# Patient Record
Sex: Male | Born: 1950 | Race: White | Marital: Married | Smoking: Never smoker
Health system: Southern US, Community
[De-identification: ages and names within clinical notes are randomized; demographics above are authoritative.]

## PROBLEM LIST (undated history)

## (undated) DIAGNOSIS — C61 Malignant neoplasm of prostate: Secondary | ICD-10-CM

## (undated) DIAGNOSIS — C4490 Unspecified malignant neoplasm of skin, unspecified: Secondary | ICD-10-CM

## (undated) DIAGNOSIS — K219 Gastro-esophageal reflux disease without esophagitis: Secondary | ICD-10-CM

## (undated) DIAGNOSIS — C4491 Basal cell carcinoma of skin, unspecified: Secondary | ICD-10-CM

## (undated) DIAGNOSIS — I10 Essential (primary) hypertension: Secondary | ICD-10-CM

## (undated) DIAGNOSIS — K7460 Unspecified cirrhosis of liver: Secondary | ICD-10-CM

## (undated) DIAGNOSIS — E119 Type 2 diabetes mellitus without complications: Secondary | ICD-10-CM

## (undated) DIAGNOSIS — T7840XA Allergy, unspecified, initial encounter: Secondary | ICD-10-CM

## (undated) DIAGNOSIS — G4700 Insomnia, unspecified: Secondary | ICD-10-CM

## (undated) DIAGNOSIS — M109 Gout, unspecified: Secondary | ICD-10-CM

## (undated) HISTORY — PX: NASAL SEPTUM SURGERY: SHX37

## (undated) HISTORY — DX: Allergy, unspecified, initial encounter: T78.40XA

## (undated) HISTORY — DX: Unspecified malignant neoplasm of skin, unspecified: C44.90

## (undated) HISTORY — DX: Malignant neoplasm of prostate: C61

## (undated) HISTORY — PX: TIPS PROCEDURE: SHX808

## (undated) HISTORY — PX: TONSILLECTOMY AND ADENOIDECTOMY: SUR1326

## (undated) HISTORY — PX: CATARACT EXTRACTION: SUR2

## (undated) HISTORY — DX: Gout, unspecified: M10.9

## (undated) HISTORY — DX: Unspecified cirrhosis of liver: K74.60

## (undated) HISTORY — DX: Basal cell carcinoma of skin, unspecified: C44.91

## (undated) HISTORY — DX: Gastro-esophageal reflux disease without esophagitis: K21.9

## (undated) HISTORY — DX: Insomnia, unspecified: G47.00

## (undated) HISTORY — DX: Type 2 diabetes mellitus without complications: E11.9

## (undated) HISTORY — PX: PROSTATE SURGERY: SHX751

---

## 2013-09-18 HISTORY — PX: HERNIA REPAIR: SHX51

## 2015-11-23 LAB — HM COLONOSCOPY

## 2017-01-22 DIAGNOSIS — M4310 Spondylolisthesis, site unspecified: Secondary | ICD-10-CM | POA: Insufficient documentation

## 2017-01-22 DIAGNOSIS — M5430 Sciatica, unspecified side: Secondary | ICD-10-CM | POA: Insufficient documentation

## 2017-01-22 DIAGNOSIS — M47816 Spondylosis without myelopathy or radiculopathy, lumbar region: Secondary | ICD-10-CM | POA: Insufficient documentation

## 2017-06-21 ENCOUNTER — Encounter: Payer: Self-pay | Admitting: Family Medicine

## 2018-09-25 LAB — HM DIABETES EYE EXAM

## 2018-10-03 LAB — HM DIABETES EYE EXAM

## 2018-10-23 LAB — HM DIABETES EYE EXAM

## 2018-10-28 LAB — HM DIABETES EYE EXAM

## 2018-11-26 LAB — HM DIABETES EYE EXAM

## 2019-03-07 ENCOUNTER — Telehealth: Payer: Self-pay

## 2019-03-07 NOTE — Telephone Encounter (Signed)
Copied from [place] [phone]. Topic: General - Other >> Mar 07, 2019 10:44 AM Carolyn Stare wrote: Req call back to become new pt

## 2019-03-10 NOTE — Telephone Encounter (Signed)
Lm to call back and schedule a new pt appt

## 2019-03-18 NOTE — Telephone Encounter (Signed)
Patient is calling again to see if he can become  New patient.  He called on 6/19 and still has not heard back from office.  CB# [phone]

## 2019-03-19 ENCOUNTER — Encounter: Payer: Self-pay | Admitting: Family Medicine

## 2019-03-19 ENCOUNTER — Telehealth: Payer: Self-pay

## 2019-03-19 ENCOUNTER — Encounter (INDEPENDENT_AMBULATORY_CARE_PROVIDER_SITE_OTHER): Payer: Self-pay

## 2019-03-19 ENCOUNTER — Ambulatory Visit (INDEPENDENT_AMBULATORY_CARE_PROVIDER_SITE_OTHER): Payer: Medicare Other | Admitting: Family Medicine

## 2019-03-19 ENCOUNTER — Other Ambulatory Visit: Payer: Self-pay

## 2019-03-19 VITALS — BP 132/60 | HR 76 | Temp 98.2°F | Resp 20 | Ht 67.75 in | Wt 300.8 lb

## 2019-03-19 DIAGNOSIS — M1A9XX0 Chronic gout, unspecified, without tophus (tophi): Secondary | ICD-10-CM

## 2019-03-19 DIAGNOSIS — E119 Type 2 diabetes mellitus without complications: Secondary | ICD-10-CM | POA: Diagnosis not present

## 2019-03-19 DIAGNOSIS — M25511 Pain in right shoulder: Secondary | ICD-10-CM

## 2019-03-19 DIAGNOSIS — E1165 Type 2 diabetes mellitus with hyperglycemia: Secondary | ICD-10-CM | POA: Insufficient documentation

## 2019-03-19 DIAGNOSIS — K219 Gastro-esophageal reflux disease without esophagitis: Secondary | ICD-10-CM | POA: Diagnosis not present

## 2019-03-19 DIAGNOSIS — K7469 Other cirrhosis of liver: Secondary | ICD-10-CM

## 2019-03-19 DIAGNOSIS — E1129 Type 2 diabetes mellitus with other diabetic kidney complication: Secondary | ICD-10-CM | POA: Insufficient documentation

## 2019-03-19 DIAGNOSIS — Z8546 Personal history of malignant neoplasm of prostate: Secondary | ICD-10-CM | POA: Diagnosis not present

## 2019-03-19 DIAGNOSIS — L299 Pruritus, unspecified: Secondary | ICD-10-CM

## 2019-03-19 DIAGNOSIS — M109 Gout, unspecified: Secondary | ICD-10-CM | POA: Insufficient documentation

## 2019-03-19 DIAGNOSIS — K7460 Unspecified cirrhosis of liver: Secondary | ICD-10-CM | POA: Insufficient documentation

## 2019-03-19 LAB — COMPREHENSIVE METABOLIC PANEL
ALT: 16 U/L (ref 0–53)
AST: 18 U/L (ref 0–37)
Albumin: 3.4 g/dL — ABNORMAL LOW (ref 3.5–5.2)
Alkaline Phosphatase: 136 U/L — ABNORMAL HIGH (ref 39–117)
BUN: 13 mg/dL (ref 6–23)
CO2: 23 mEq/L (ref 19–32)
Calcium: 9 mg/dL (ref 8.4–10.5)
Chloride: 102 mEq/L (ref 96–112)
Creatinine, Ser: 1 mg/dL (ref 0.40–1.50)
GFR: 74.23 mL/min (ref >60.00)
Glucose, Bld: 328 mg/dL — ABNORMAL HIGH (ref 70–99)
Potassium: 4.5 mEq/L (ref 3.5–5.1)
Sodium: 134 mEq/L — ABNORMAL LOW (ref 135–145)
Total Bilirubin: 0.8 mg/dL (ref 0.2–1.2)
Total Protein: 6.5 g/dL (ref 6.0–8.3)

## 2019-03-19 LAB — HEMOGLOBIN A1C: Hgb A1c MFr Bld: 11.1 % — ABNORMAL HIGH (ref 4.6–6.5)

## 2019-03-19 MED ORDER — FREESTYLE LIBRE 14 DAY SENSOR MISC: 1.0000 | 6 refills | Status: DC

## 2019-03-19 MED ORDER — HYDROXYZINE HCL 50 MG PO TABS: 50.0000 mg | ORAL_TABLET | Freq: Three times a day (TID) | ORAL | 1 refills | Status: DC | PRN

## 2019-03-19 MED ORDER — ALLOPURINOL 100 MG PO TABS: 100.0000 mg | ORAL_TABLET | Freq: Every day | ORAL | 1 refills | Status: DC

## 2019-03-19 NOTE — Progress Notes (Signed)
Subjective:     Adam Duncan is a 68 y.o. male presenting for Establish Care (previous PCP Dr. Radford Pax in [place].) and Arm Pain (right. x several years. has had physical therapy in the past.)     HPI   #Diabetes - 6 months since last checked - hemoglobin a1c 7.1 - has had cbg 250-300  - not doing as well with diet lately   #arm pain - has been to therapy 2x  - moving recently and now with more pain - told he had a ligament injury in the past - worse with sleeping on that side and moving certain positions - not painful all the time - taking ibuprofen 2-3 pills per day depending on the symptoms (over the counter dose) - with improvement  #Liver cirrhosis - does have skin itching - early stages based on blood work, but biopsy proven   #allergies - lots of skin itching  - taking hydroxyzine which is helpful - will get red patches - generally associated with rash  #Prostate cancer - 10 years ago - told he did not see urology  - has been getting annual PSAs to screen which have been very low - wearing pads because he leaks  Review of Systems See HPI  Social History   Tobacco Use  Smoking Status Never Smoker  Smokeless Tobacco Never Used        Objective:    BP Readings from Last 3 Encounters:  03/19/19 132/60   Wt Readings from Last 3 Encounters:  03/19/19 (!) 300 lb 12 oz (136.4 kg)    BP 132/60   Pulse 76   Temp 98.2 F (36.8 C)   Resp 20   Ht 5' 7.75" (1.721 m)   Wt (!) 300 lb 12 oz (136.4 kg)   SpO2 96%   BMI 46.07 kg/m    Physical Exam Constitutional:      Appearance: Normal appearance. He is obese. He is not ill-appearing or diaphoretic.  HENT:     Right Ear: External ear normal.     Left Ear: External ear normal.     Nose: Nose normal.  Eyes:     General: No scleral icterus.    Extraocular Movements: Extraocular movements intact.     Conjunctiva/sclera: Conjunctivae normal.  Neck:     Musculoskeletal: Neck supple.   Cardiovascular:     Rate and Rhythm: Normal rate and regular rhythm.     Heart sounds: Murmur (grade 2/6 systolic) present.  Pulmonary:     Effort: Pulmonary effort is normal. No respiratory distress.     Breath sounds: Normal breath sounds. No wheezing.  Musculoskeletal:     Comments: Right shoulder:  Inspection: no swelling ROM: flexion and abduction limited beyond 90 degrees, passive able to get full but with pain Strength: significant pain with supraspinatus testing, normal internal/external rotation  Skin:    General: Skin is warm and dry.  Neurological:     Mental Status: He is alert. Mental status is at baseline.  Psychiatric:        Mood and Affect: Mood normal.           Assessment & Plan:   Problem List Items Addressed This Visit      Digestive   GERD (gastroesophageal reflux disease)   Relevant Medications   LACTULOSE PO   pantoprazole (PROTONIX) 40 MG tablet   Cirrhosis of liver (HCC)    Non-alcoholic cirrhosis. Will obtain outside records. LFTs today and assess for other  monitoring. Reports some recent work-up done, has been stable overall.       Relevant Medications   hydrOXYzine (ATARAX/VISTARIL) 50 MG tablet   Other Relevant Orders   Comprehensive metabolic panel     Endocrine   Diabetes mellitus without complication ([place]) - Primary    Not doing well with diet. Will check HgbA1c. Advised focusing on lifestyle and will f/u 4 months depending on where the hgba1c is.       Relevant Medications   glipiZIDE (GLUCOTROL XL) 10 MG 24 hr tablet   metFORMIN (GLUCOPHAGE) 500 MG tablet   Continuous Blood Gluc Sensor (FREESTYLE LIBRE 14 DAY SENSOR) MISC   Other Relevant Orders   Comprehensive metabolic panel   Hemoglobin A1c     Other   History of prostate cancer    Will need annual PSA to screen for recurrence. Otherwise doing OK, some incontinence       Gout    No episodes since initial. Given liver disease likely needs allopurinol.       Relevant  Medications   allopurinol (ZYLOPRIM) 100 MG tablet   Acute pain of right shoulder    Likely rotator cuff or impingement injury. Did well with PT in the past will refer again.       Relevant Orders   Ambulatory referral to Physical Therapy   Itching    Stable with hydroxyzine. Typically related to rashes but creams do not work.        Relevant Medications   hydrOXYzine (ATARAX/VISTARIL) 50 MG tablet       Return in about 4 months (around 07/20/2019) for diabetes.  Lesleigh Noe, MD

## 2019-03-19 NOTE — Telephone Encounter (Signed)
LMTCB to scheduled new patient appointment,

## 2019-03-19 NOTE — Assessment & Plan Note (Signed)
Not doing well with diet. Will check HgbA1c. Advised focusing on lifestyle and will f/u 4 months depending on where the hgba1c is.

## 2019-03-19 NOTE — Assessment & Plan Note (Signed)
Will need annual PSA to screen for recurrence. Otherwise doing OK, some incontinence

## 2019-03-19 NOTE — Assessment & Plan Note (Signed)
Non-alcoholic cirrhosis. Will obtain outside records. LFTs today and assess for other monitoring. Reports some recent work-up done, has been stable overall.

## 2019-03-19 NOTE — Telephone Encounter (Signed)
[facility] [address] left v/m request clarification on instructions but did not leave name of med. [facility] said Adam Duncan has already answered the question and nothing further needed.

## 2019-03-19 NOTE — Assessment & Plan Note (Signed)
Likely rotator cuff or impingement injury. Did well with PT in the past will refer again.

## 2019-03-19 NOTE — Patient Instructions (Signed)
#  Diabetes - work on following the diabetic diet  #Arm pain - physical therapy referral - continue home exercise - OK for ibuprofen in small amounts - can do Tylenol as well

## 2019-03-19 NOTE — Assessment & Plan Note (Signed)
Stable with hydroxyzine. Typically related to rashes but creams do not work.

## 2019-03-19 NOTE — Assessment & Plan Note (Signed)
No episodes since initial. Given liver disease likely needs allopurinol.

## 2019-03-19 NOTE — Telephone Encounter (Signed)
Patient established care at [facility] instead.

## 2019-03-20 ENCOUNTER — Telehealth: Payer: Self-pay | Admitting: Family Medicine

## 2019-03-20 DIAGNOSIS — E119 Type 2 diabetes mellitus without complications: Secondary | ICD-10-CM

## 2019-03-20 MED ORDER — DULAGLUTIDE 0.75 MG/0.5ML ~~LOC~~ SOAJ: 0.7500 mg | SUBCUTANEOUS | 4 refills | Status: DC

## 2019-03-20 NOTE — Telephone Encounter (Signed)
Called to discussed elevated Hemoglobin J3W  Was on Trulicity and did not noticed a difference, though HgbA1c was always 7-8 range.   Discussed restarting which he agreed to do

## 2019-03-26 ENCOUNTER — Telehealth: Payer: Self-pay

## 2019-03-26 ENCOUNTER — Encounter: Payer: Self-pay | Admitting: Family Medicine

## 2019-03-26 DIAGNOSIS — R011 Cardiac murmur, unspecified: Secondary | ICD-10-CM | POA: Insufficient documentation

## 2019-03-26 DIAGNOSIS — E119 Type 2 diabetes mellitus without complications: Secondary | ICD-10-CM

## 2019-03-26 NOTE — Telephone Encounter (Signed)
Patient called to get clarification. When he saw Dr. Einar Pheasant last he thought that she was going to add another medication/tablets for his diabetes. He received Trulicity injection but Metformin and Glipizide were not filled. He states he may have misunderstood and maybe he is suppose to just add Trulicity injection and continue Metformin and Glipizide. He just wanted to double check. He needs refills on medications if he is to continue.

## 2019-03-27 MED ORDER — METFORMIN HCL 1000 MG PO TABS: 1000.0000 mg | ORAL_TABLET | Freq: Two times a day (BID) | ORAL | 3 refills | Status: DC

## 2019-03-27 MED ORDER — GLIPIZIDE ER 10 MG PO TB24: 10.0000 mg | ORAL_TABLET | Freq: Every day | ORAL | 3 refills | Status: DC

## 2019-03-27 NOTE — Telephone Encounter (Signed)
Left message to call back  

## 2019-03-27 NOTE — Telephone Encounter (Signed)
Patient advised and verbalized understanding 

## 2019-03-27 NOTE — Telephone Encounter (Signed)
Patient should continue metformin and glipizide  Also, increased metformin to 1000 mg tablet -- same dose as before but will just need to take 1 pill twice daily

## 2019-03-27 NOTE — Telephone Encounter (Signed)
Patient returned call from the office  °

## 2019-03-31 ENCOUNTER — Telehealth: Payer: Self-pay | Admitting: Family Medicine

## 2019-03-31 NOTE — Telephone Encounter (Signed)
Rec'd from [facility] forwarded 23 pages to Dr. Waunita Schooner R

## 2019-04-07 ENCOUNTER — Telehealth: Payer: Self-pay | Admitting: Family Medicine

## 2019-04-07 DIAGNOSIS — E559 Vitamin D deficiency, unspecified: Secondary | ICD-10-CM

## 2019-04-07 NOTE — Telephone Encounter (Signed)
Spoke with patient. He has been taking Vitamin D RX for about a year. Advised of Dr. Verda Cumins recommendations. Patient will call back to schedule lab visit. Please place future order. In the meantime if he runs out of this medication he will take OTC but does want to check level.

## 2019-04-07 NOTE — Addendum Note (Signed)
Addended by: Lesleigh Noe on: 04/07/2019 04:06 PM   Modules accepted: Orders

## 2019-04-07 NOTE — Telephone Encounter (Signed)
Received a call from [facility] requesting refill of Vitamin D 50,000 units.  Medication is not on patient's current medication list. Patient was last seen on 03/19/2019.

## 2019-04-07 NOTE — Telephone Encounter (Signed)
Please clarify why patient is taking medication.   I typically only do the high dose replacement for 8 weeks.   No recent labs to assess Vit D deficiency. If wanting to continue will need to get blood work  Otherwise, would recommend daily Vit D [phone] units

## 2019-04-22 ENCOUNTER — Other Ambulatory Visit: Payer: Self-pay

## 2019-04-22 ENCOUNTER — Ambulatory Visit (INDEPENDENT_AMBULATORY_CARE_PROVIDER_SITE_OTHER): Payer: Medicare Other | Admitting: Family Medicine

## 2019-04-22 ENCOUNTER — Encounter: Payer: Self-pay | Admitting: Family Medicine

## 2019-04-22 VITALS — BP 126/68 | HR 64 | Temp 99.0°F | Ht 67.75 in | Wt 300.2 lb

## 2019-04-22 DIAGNOSIS — E119 Type 2 diabetes mellitus without complications: Secondary | ICD-10-CM

## 2019-04-22 DIAGNOSIS — K7469 Other cirrhosis of liver: Secondary | ICD-10-CM | POA: Diagnosis not present

## 2019-04-22 DIAGNOSIS — G4700 Insomnia, unspecified: Secondary | ICD-10-CM

## 2019-04-22 DIAGNOSIS — M545 Low back pain, unspecified: Secondary | ICD-10-CM | POA: Insufficient documentation

## 2019-04-22 DIAGNOSIS — R109 Unspecified abdominal pain: Secondary | ICD-10-CM

## 2019-04-22 DIAGNOSIS — M7062 Trochanteric bursitis, left hip: Secondary | ICD-10-CM | POA: Diagnosis not present

## 2019-04-22 LAB — POC URINALSYSI DIPSTICK (AUTOMATED)
Bilirubin, UA: NEGATIVE
Blood, UA: NEGATIVE
Glucose, UA: POSITIVE — AB
Ketones, UA: NEGATIVE
Leukocytes, UA: NEGATIVE
Nitrite, UA: NEGATIVE
Protein, UA: NEGATIVE
Spec Grav, UA: 1.015 (ref 1.010–1.025)
Urobilinogen, UA: 0.2 E.U./dL
pH, UA: 6 (ref 5.0–8.0)

## 2019-04-22 MED ORDER — TEMAZEPAM 15 MG PO CAPS: 15.0000 mg | ORAL_CAPSULE | Freq: Every day | ORAL | 1 refills | Status: DC

## 2019-04-22 MED ORDER — LACTULOSE 10 GM/15ML PO SOLN: 20.0000 g | Freq: Two times a day (BID) | ORAL | 1 refills | Status: DC

## 2019-04-22 MED ORDER — RIFAXIMIN 550 MG PO TABS: 550.0000 mg | ORAL_TABLET | Freq: Two times a day (BID) | ORAL | 1 refills | Status: DC

## 2019-04-22 MED ORDER — ACETAMINOPHEN 500 MG PO TABS: 500.0000 mg | ORAL_TABLET | Freq: Three times a day (TID) | ORAL | Status: DC | PRN

## 2019-04-22 NOTE — Patient Instructions (Addendum)
I think you do have hip bursitis on the left but also abdominal pain on the left - which may also be musculoskeletal. Labs and urine test today. If unrevealing, we may check abdominal ultrasound to evaluate the liver/spleen. We will be in touch with results.

## 2019-04-22 NOTE — Progress Notes (Signed)
This visit was conducted in person.  BP 126/68 (BP Location: Left Arm, Patient Position: Sitting, Cuff Size: Large)   Pulse 64   Temp 99 F (37.2 C) (Temporal)   Ht 5' 7.75" (1.721 m)   Wt (!) 300 lb 4 oz (136.2 kg)   SpO2 96%   BMI 45.99 kg/m    CC: back pain Subjective:    Patient ID: Adam Duncan, male    DOB: December 30, 1950, 68 y.o.   MRN: [id number]  HPI: Adam Duncan is a 68 y.o. male presenting on 04/22/2019 for Back Pain (C/o back pain.  Pain starts in lower left side radiating to lower back and down left leg.  Started about 2 mos ago.  Tried Tylenol, helpful. ) and Medication Refill (Reqesting refill for lactulose. )   1 mo h/o constant L side pain that travels to lower back and into groin. Describes aching pain. No pain down leg. No numbness/weakness of legs. No bowel/bladder incontinence (chronically wears pads since prostatectomy). No fevers/chills. Denies inciting trauma/injury or falls. Tylenol helps 563m TID PRN. Denies dysuria, urgency, hematuria, n/v, diarrhea/constipation, blood in stool. Chronic frequency.   Diabetic - sugars have been elevated. Cirrhosis - has been on xifaxan twice daily long term - requests refill.  TIPS into jugular veins.  Requests refill of restoril - takes nightly for insomnia longterm. Discussed controlled substance will refill temporarily and have him f/u with PCP (out on maternity leave).  Recently moved from [place]      Relevant past medical, surgical, family and social history reviewed and updated as indicated. Interim medical history since our last visit reviewed. Allergies and medications reviewed and updated. Outpatient Medications Prior to Visit  Medication Sig Dispense Refill  . allopurinol (ZYLOPRIM) 100 MG tablet Take 1 tablet (100 mg total) by mouth daily. 90 tablet 1  . Continuous Blood Gluc Sensor (FREESTYLE LIBRE 14 DAY SENSOR) MISC 1 each by Does not apply route every 14 (fourteen) days. DX E11.9 6 each 6  .  Dulaglutide 0.75 MG/0.5ML SOPN Inject 0.75 mg into the skin once a week. 0.5 mL 4  . Ergocalciferol (VITAMIN D2 PO) Take 1 tablet by mouth daily.    .Marland KitchenglipiZIDE (GLUCOTROL XL) 10 MG 24 hr tablet Take 1 tablet (10 mg total) by mouth daily with breakfast. 90 tablet 3  . hydrOXYzine (ATARAX/VISTARIL) 50 MG tablet Take 1 tablet (50 mg total) by mouth every 8 (eight) hours as needed for itching. 270 tablet 1  . metFORMIN (GLUCOPHAGE) 1000 MG tablet Take 1 tablet (1,000 mg total) by mouth 2 (two) times daily with a meal. 180 tablet 3  . montelukast (SINGULAIR) 10 MG tablet Take 10 mg by mouth at bedtime.    . pantoprazole (PROTONIX) 40 MG tablet Take 40 mg by mouth daily.    .Marland Kitchenspironolactone (ALDACTONE) 100 MG tablet Take 100 mg by mouth daily.    .Marland KitchenLACTULOSE PO Take 40 mLs by mouth 2 (two) times a day.    . rifaximin (XIFAXAN) 550 MG TABS tablet Take 550 mg by mouth 2 (two) times daily.    . temazepam (RESTORIL) 15 MG capsule Take 15 mg by mouth at bedtime.     No facility-administered medications prior to visit.      Per HPI unless specifically indicated in ROS section below Review of Systems Objective:    BP 126/68 (BP Location: Left Arm, Patient Position: Sitting, Cuff Size: Large)   Pulse 64   Temp 99 F (37.2  C) (Temporal)   Ht 5' 7.75" (1.721 m)   Wt (!) 300 lb 4 oz (136.2 kg)   SpO2 96%   BMI 45.99 kg/m   Wt Readings from Last 3 Encounters:  04/22/19 (!) 300 lb 4 oz (136.2 kg)  03/19/19 (!) 300 lb 12 oz (136.4 kg)    Physical Exam Vitals signs and nursing note reviewed.  Constitutional:      General: He is not in acute distress.    Appearance: Normal appearance. He is not ill-appearing.  HENT:     Mouth/Throat:     Mouth: Mucous membranes are moist.     Pharynx: No posterior oropharyngeal erythema.  Abdominal:     General: Abdomen is flat. Bowel sounds are normal. There is no distension.     Palpations: Abdomen is soft. There is no mass.     Tenderness: There is  abdominal tenderness (mild-mod) in the left upper quadrant and left lower quadrant. There is no right CVA tenderness, left CVA tenderness, guarding or rebound. Negative signs include Murphy's sign.     Hernia: No hernia is present.     Comments: No appreciable inguinal hernia  Musculoskeletal: Normal range of motion.        General: No swelling or tenderness.     Comments:  No pain midline spine No paraspinous mm tenderness Neg SLR bilaterally. No pain with int/ext rotation at hip. No significant pain at SIJ or sciatic notch bilaterally.  Tender at L lateral hip at trochanteric bursitis  Skin:    Findings: No rash.  Neurological:     Mental Status: He is alert.  Psychiatric:        Mood and Affect: Mood normal.        Behavior: Behavior normal.       Results for orders placed or performed in visit on 04/22/19  POCT Urinalysis Dipstick (Automated)  Result Value Ref Range   Color, UA light yellow    Clarity, UA clear    Glucose, UA Positive (A) Negative   Bilirubin, UA negative    Ketones, UA negative    Spec Grav, UA 1.015 1.010 - 1.025   Blood, UA negative    pH, UA 6.0 5.0 - 8.0   Protein, UA Negative Negative   Urobilinogen, UA 0.2 0.2 or 1.0 E.U./dL   Nitrite, UA negative    Leukocytes, UA Negative Negative   Lab Results  Component Value Date   CREATININE 1.00 03/19/2019   BUN 13 03/19/2019   NA 134 (L) 03/19/2019   K 4.5 03/19/2019   CL 102 03/19/2019   CO2 23 03/19/2019    Lab Results  Component Value Date   ALT 16 03/19/2019   AST 18 03/19/2019   ALKPHOS 136 (H) 03/19/2019   BILITOT 0.8 03/19/2019    Lab Results  Component Value Date   HGBA1C 11.1 (H) 03/19/2019   No results found for: WBC, HGB, HCT, MCV, PLT  Assessment & Plan:  Recently moved from [place].  Problem List Items Addressed This Visit    Trochanteric bursitis of left hip    Exam consistent with this - separate from abdominal pain.       Left sided abdominal pain - Primary    New  over the past month, of unclear etiology. UA normal pointing against kidney stone. No obvious hernia. Not consistent with SBP. ?MSK given worse with contraction of abdominal wall. Will check labs today (CBC, CMP) and if unrevealing, in h/o cirrhosis, low threshold  to check abd [place] to eval liver/spleen/ascites. Pt agrees with plan.       Relevant Orders   Comprehensive metabolic panel   CBC with Differential/Platelet   POCT Urinalysis Dipstick (Automated) (Completed)   Insomnia    Long term on temazepam for sleep. Pt was now aware this is a controlled substance. Will refill while PCP returns to office, then to discuss with her regarding continuation of this. [place] CSRS reviewed.       Diabetes mellitus without complication ([place])    Reviewed recent poor control. He is working towards diabetic diet.      Cirrhosis of liver (HCC)    Lactulose refilled at dose he thinks he's taking - advised to verify with home dose. xifaxan refilled temporarily, advised I don't routinely fill this med but will leave to PCP - who is currently out on maternity leave.           Meds ordered this encounter  Medications  . temazepam (RESTORIL) 15 MG capsule    Sig: Take 1 capsule (15 mg total) by mouth at bedtime.    Dispense:  30 capsule    Refill:  1  . rifaximin (XIFAXAN) 550 MG TABS tablet    Sig: Take 1 tablet (550 mg total) by mouth 2 (two) times daily.    Dispense:  60 tablet    Refill:  1  . acetaminophen (TYLENOL) 500 MG tablet    Sig: Take 1 tablet (500 mg total) by mouth every 8 (eight) hours as needed.  . lactulose (CHRONULAC) 10 GM/15ML solution    Sig: Take 30 mLs (20 g total) by mouth 2 (two) times daily.    Dispense:  1892 mL    Refill:  1   Orders Placed This Encounter  Procedures  . Comprehensive metabolic panel  . CBC with Differential/Platelet  . POCT Urinalysis Dipstick (Automated)    Patient Instructions  I think you do have hip bursitis on the left but also abdominal pain on  the left - which may also be musculoskeletal. Labs and urine test today. If unrevealing, we may check abdominal ultrasound to evaluate the liver/spleen. We will be in touch with results.    Follow up plan: Return if symptoms worsen or fail to improve.  Ria Bush, MD

## 2019-04-22 NOTE — Assessment & Plan Note (Addendum)
New over the past month, of unclear etiology. UA normal pointing against kidney stone. No obvious hernia. Not consistent with SBP. ?MSK given worse with contraction of abdominal wall. Will check labs today (CBC, CMP) and if unrevealing, in h/o cirrhosis, low threshold to check abd [place] to eval liver/spleen/ascites. Pt agrees with plan.

## 2019-04-22 NOTE — Assessment & Plan Note (Signed)
Long term on temazepam for sleep. Pt was now aware this is a controlled substance. Will refill while PCP returns to office, then to discuss with her regarding continuation of this. [place] CSRS reviewed.

## 2019-04-22 NOTE — Assessment & Plan Note (Signed)
Exam consistent with this - separate from abdominal pain.

## 2019-04-22 NOTE — Assessment & Plan Note (Signed)
Reviewed recent poor control. He is working towards diabetic diet.

## 2019-04-22 NOTE — Assessment & Plan Note (Signed)
Lactulose refilled at dose he thinks he's taking - advised to verify with home dose. xifaxan refilled temporarily, advised I don't routinely fill this med but will leave to PCP - who is currently out on maternity leave.

## 2019-04-23 ENCOUNTER — Other Ambulatory Visit: Payer: Self-pay | Admitting: Family Medicine

## 2019-04-23 DIAGNOSIS — R109 Unspecified abdominal pain: Secondary | ICD-10-CM

## 2019-04-23 LAB — CBC WITH DIFFERENTIAL/PLATELET
Basophils Absolute: 0 10*3/uL (ref 0.0–0.1)
Basophils Relative: 0.7 % (ref 0.0–3.0)
Eosinophils Absolute: 0.1 10*3/uL (ref 0.0–0.7)
Eosinophils Relative: 2.7 % (ref 0.0–5.0)
HCT: 36.5 % — ABNORMAL LOW (ref 39.0–52.0)
Hemoglobin: 11.8 g/dL — ABNORMAL LOW (ref 13.0–17.0)
Lymphocytes Relative: 21.9 % (ref 12.0–46.0)
Lymphs Abs: 0.9 10*3/uL (ref 0.7–4.0)
MCHC: 32.3 g/dL (ref 30.0–36.0)
MCV: 80.8 fl (ref 78.0–100.0)
Monocytes Absolute: 0.5 10*3/uL (ref 0.1–1.0)
Monocytes Relative: 12.1 % — ABNORMAL HIGH (ref 3.0–12.0)
Neutro Abs: 2.7 10*3/uL (ref 1.4–7.7)
Neutrophils Relative %: 62.6 % (ref 43.0–77.0)
Platelets: 142 10*3/uL — ABNORMAL LOW (ref 150.0–400.0)
RBC: 4.51 Mil/uL (ref 4.22–5.81)
RDW: 18.2 % — ABNORMAL HIGH (ref 11.5–15.5)
WBC: 4.3 10*3/uL (ref 4.0–10.5)

## 2019-04-23 LAB — COMPREHENSIVE METABOLIC PANEL
ALT: 15 U/L (ref 0–53)
AST: 20 U/L (ref 0–37)
Albumin: 3.7 g/dL (ref 3.5–5.2)
Alkaline Phosphatase: 134 U/L — ABNORMAL HIGH (ref 39–117)
BUN: 17 mg/dL (ref 6–23)
CO2: 26 mEq/L (ref 19–32)
Calcium: 9.6 mg/dL (ref 8.4–10.5)
Chloride: 101 mEq/L (ref 96–112)
Creatinine, Ser: 0.93 mg/dL (ref 0.40–1.50)
GFR: 80.69 mL/min (ref >60.00)
Glucose, Bld: 169 mg/dL — ABNORMAL HIGH (ref 70–99)
Potassium: 4.5 mEq/L (ref 3.5–5.1)
Sodium: 136 mEq/L (ref 135–145)
Total Bilirubin: 0.7 mg/dL (ref 0.2–1.2)
Total Protein: 6.8 g/dL (ref 6.0–8.3)

## 2019-04-28 ENCOUNTER — Other Ambulatory Visit: Payer: Self-pay

## 2019-04-28 ENCOUNTER — Telehealth: Payer: Self-pay | Admitting: *Deleted

## 2019-04-28 ENCOUNTER — Ambulatory Visit
Admission: RE | Admit: 2019-04-28 | Discharge: 2019-04-28 | Disposition: A | Discharge status: Home / Self Care | Payer: Medicare Other | Source: Ambulatory Visit | Attending: Family Medicine | Admitting: Family Medicine

## 2019-04-28 DIAGNOSIS — R109 Unspecified abdominal pain: Secondary | ICD-10-CM | POA: Diagnosis present

## 2019-04-28 MED ORDER — LACTULOSE 10 GM/15ML PO SOLN: 20.0000 g | Freq: Two times a day (BID) | ORAL | 0 refills | Status: DC

## 2019-04-28 MED ORDER — LACTULOSE 10 GM/15ML PO SOLN: 26.6500 g | Freq: Two times a day (BID) | ORAL | 0 refills | Status: DC

## 2019-04-28 NOTE — Telephone Encounter (Signed)
Per note below from [facility] [employer] it states insurance likes/prefers 90 day supply. Will send for Dr. Darnell Level to review if ok to change for 90 day supply

## 2019-04-28 NOTE — Telephone Encounter (Signed)
Yes please. Ordered.

## 2019-04-28 NOTE — Telephone Encounter (Signed)
Patient left a voicemail stating that he wanted to let Dr. Danise Mina know that he is taking Lactulose 40 ML twice a day.

## 2019-04-28 NOTE — Telephone Encounter (Signed)
Received a call from ultrasound stating that patient is their now and they have an order for US abdominal complete. Allysa stated that patient is telling them that his pain is more in the left groin area and he thinks that he may have a hernia. Allysa stated that patient is rather large and not sure that they will be able to see much doing the pelvic ultrasound. Artelia Laroche is wanting to know if Dr. Danise Mina would like to add an ultrasound of the pelvic area?

## 2019-04-28 NOTE — Telephone Encounter (Signed)
Noted. That is a very strange dose. Please verify he means 40m twice daily and not 40gm twice daily.

## 2019-04-28 NOTE — Telephone Encounter (Addendum)
I've sent in 90d supply

## 2019-04-28 NOTE — Telephone Encounter (Signed)
Left message for clarification on exactly what patient needs.  Appears patient received a 30 day R/X on 04/22/19 from Dr. Danise Mina and should not need any medication currently.  I am questioning whether patient is asking for a new 90 day r/x to be sent in as [facility] message is a little unclear.    Patient is to call back to the main number with further details explaining what he needs.  Thanks.

## 2019-04-28 NOTE — Telephone Encounter (Signed)
[place] Night - Client TELEPHONE ADVICE RECORD [facility] Patient Name: Adam Duncan Gender: Male DOB: 01-10-1951 Age: 68 Y 13 M 24 D Return Phone Number: [phone] (Primary) [place] [address] Client [place] Night - Client Client Site [place] Physician Ria Bush - MD Contact Type Call Who Is Calling Patient / Member / Family / Caregiver Call Type Triage / Clinical Caller Name Alfio Loescher Relationship To Patient Self Return Phone Number [phone] (Primary) Chief Complaint Prescription Refill or Medication Request (non symptomatic) Reason for Call Medication Question / Request Initial Comment Caller states, needs a refill on Lactulose 66m X2 daily. Insurance likes 90 day refill. No symotoms. Translation No Nurse Assessment Guidelines Guideline Title Affirmed Question Affirmed Notes Nurse Date/Time (Eastern Time) Disp. Time (Eilene GhaziTime) Disposition Final User 04/26/2019 12:40:47 PM Send To Nurse ARia Comment RN, April 04/26/2019 12:46:56 PM Attempt made - message left DMarlou Porch8/04/2019 12:58:17 PM FINAL ATTEMPT

## 2019-04-29 NOTE — Telephone Encounter (Signed)
Patient returned Lisa's call.  Patient takes 40 ml 2 x a day of Lactulose. Patient had his Ultrasound done yesterday.

## 2019-04-29 NOTE — Telephone Encounter (Signed)
Left message on vm per dpr asking pt to call back to clarify his lactulose dose. 40 ml or 40 g?

## 2019-05-01 ENCOUNTER — Telehealth: Payer: Self-pay

## 2019-05-01 DIAGNOSIS — R109 Unspecified abdominal pain: Secondary | ICD-10-CM

## 2019-05-01 DIAGNOSIS — R1032 Left lower quadrant pain: Secondary | ICD-10-CM

## 2019-05-01 NOTE — Telephone Encounter (Signed)
See result note. Spoke with patient. Ongoing pain - will order CT.

## 2019-05-01 NOTE — Telephone Encounter (Signed)
[place] Night - Client TELEPHONE ADVICE RECORD [facility] Patient Name: Adam Duncan [facility] Gender: Male DOB: 11/10/1950 Age: 68 Y 62 M 28 D Return Phone Number: [phone] (Primary) Address: City/State/Zip: [place] [address] Client [place] Night - Client Client Site [place] Physician Ria Bush - MD Contact Type Call Who Is Calling Patient / Member / Family / Caregiver Call Type Triage / Clinical Relationship To Patient Self Return Phone Number [phone] (Primary) Chief Complaint CHEST PAIN (>=21 years) - pain, pressure, heaviness or tightness Reason for Call Request to Speak to a Physician Initial Comment Caller states he missed a call from the Dr. about ten minutes ago. He is wishing to speak with Dr. Dr. had called regarding an enlarged spleen. He was asked in the message if he was still experiencing pain, and he is. Translation No Nurse Assessment Guidelines Guideline Title Affirmed Question Affirmed Notes Nurse Date/Time (Eastern Time) Disp. Time Eilene Ghazi Time) Disposition Final User 04/30/2019 5:23:19 PM Send to Urgent Queue [place], Sherlynn Stalls 04/30/2019 5:30:00 PM Paged On Call back to [facility] Radford Pax, RN, [place] 04/30/2019 5:31:07 PM Clinical Call Yes Radford Pax, RN, Eugene Garnet Comments User: Susie Cassette, RN Date/Time Eilene Ghazi Time): 04/30/2019 5:31:48 PM Informed pt that msg was left for Dr. Danise Mina. If he hasn't heard back within 10-15 minutes, please give [place] a callback. Paging DoctorName Phone DateTime Result/Outcome Message Type Notes Ria Bush - MD [phone] 04/30/2019 5:30:00 PM Called On Call Provider - Left Message Doctor Paged Ria Bush - MD 04/30/2019 5:30:37 PM Spoke with On Call - General Message Result Left message with provider that pt is waiting for a call back from the provider.

## 2019-05-01 NOTE — Telephone Encounter (Signed)
Per result note for [place] of abd done on 04/28/19 pt spoke with Dr Darnell Level at 5:37 pm on 04/30/19.

## 2019-05-06 ENCOUNTER — Other Ambulatory Visit: Payer: Self-pay

## 2019-05-06 ENCOUNTER — Ambulatory Visit
Admission: RE | Admit: 2019-05-06 | Discharge: 2019-05-06 | Disposition: A | Discharge status: Home / Self Care | Payer: Medicare Other | Source: Ambulatory Visit | Attending: Family Medicine | Admitting: Family Medicine

## 2019-05-06 DIAGNOSIS — R1032 Left lower quadrant pain: Secondary | ICD-10-CM | POA: Insufficient documentation

## 2019-05-06 DIAGNOSIS — R109 Unspecified abdominal pain: Secondary | ICD-10-CM | POA: Diagnosis present

## 2019-05-06 HISTORY — DX: Essential (primary) hypertension: I10

## 2019-05-06 MED ORDER — IOHEXOL 300 MG/ML  SOLN
100.0000 mL | Freq: Once | INTRAMUSCULAR | Status: AC | PRN
  Administered 2019-05-06: 100 mL via INTRAVENOUS

## 2019-05-07 ENCOUNTER — Telehealth: Payer: Self-pay

## 2019-05-07 NOTE — Telephone Encounter (Signed)
Scheduled pt for 05/08/19 @ 10:30am. He said it is his side and lower back. Should I make the appointment 30 mins?

## 2019-05-07 NOTE — Telephone Encounter (Signed)
Patient left a voicemail stating that he missed a call from the office.  Please call patient back and schedule appointment as instructed.

## 2019-05-07 NOTE — Telephone Encounter (Signed)
No, 15 is fine.

## 2019-05-07 NOTE — Telephone Encounter (Signed)
Left message on vm per dpr asking pt to call back to schedule and in office visit with Dr. Darnell Level tomorrow to re-evaluate abd pain.

## 2019-05-08 ENCOUNTER — Ambulatory Visit (INDEPENDENT_AMBULATORY_CARE_PROVIDER_SITE_OTHER): Payer: Medicare Other | Admitting: Family Medicine

## 2019-05-08 ENCOUNTER — Encounter: Payer: Self-pay | Admitting: Family Medicine

## 2019-05-08 ENCOUNTER — Other Ambulatory Visit: Payer: Self-pay

## 2019-05-08 VITALS — BP 136/62 | HR 84 | Temp 98.6°F | Ht 67.75 in | Wt 296.3 lb

## 2019-05-08 DIAGNOSIS — M545 Low back pain, unspecified: Secondary | ICD-10-CM

## 2019-05-08 MED ORDER — METHOCARBAMOL 500 MG PO TABS: 500.0000 mg | ORAL_TABLET | Freq: Three times a day (TID) | ORAL | 0 refills | Status: DC | PRN

## 2019-05-08 NOTE — Patient Instructions (Addendum)
Flu shot today GI workup overall reassuring.  I think this is musculoskeletal back pain.  Try heating pad covered in towel for 10-15 min at a time  Try robaxin muscle relaxant sent to pharmacy twice a day as needed.  May continue tylenol as needed Gentle stretching of lower back - look at exercises provided.

## 2019-05-08 NOTE — Progress Notes (Signed)
This visit was conducted in person.  BP 136/62 (BP Location: Left Arm, Patient Position: Sitting, Cuff Size: Large)   Pulse 84   Temp 98.6 F (37 C) (Temporal)   Ht 5' 7.75" (1.721 m)   Wt 296 lb 5 oz (134.4 kg)   SpO2 96%   BMI 45.39 kg/m    CC: ongoing back pain Subjective:    Patient ID: Adam Duncan, male    DOB: 09/24/1950, 68 y.o.   MRN: [id number]  HPI: Adam Duncan is a 68 y.o. male presenting on 05/08/2019 for Back Pain (C/o low left side back and side pain worsening.  Says last 2 days, pain was severe.  Today, pain is better. )   See prior note for details.  Workup included reassuring labs, abd [place], abd/pelvic CT with contrast. See below.  Ongoing L lower back and L lower abdominal pain that is worsening. Acutely worse last 2 days, today feeling some better. Predominant pain is in lower left back with radiation to side. Worse with going from bending over to upright. Denies inciting trauma/injury. He did heavy lifting 02/2019 during move from [place].   Symptoms started early July.   Continues tylenol 562m TID PRN.      Relevant past medical, surgical, family and social history reviewed and updated as indicated. Interim medical history since our last visit reviewed. Allergies and medications reviewed and updated. Outpatient Medications Prior to Visit  Medication Sig Dispense Refill  . acetaminophen (TYLENOL) 500 MG tablet Take 1 tablet (500 mg total) by mouth every 8 (eight) hours as needed.    .Marland Kitchenallopurinol (ZYLOPRIM) 100 MG tablet Take 1 tablet (100 mg total) by mouth daily. 90 tablet 1  . Continuous Blood Gluc Sensor (FREESTYLE LIBRE 14 DAY SENSOR) MISC 1 each by Does not apply route every 14 (fourteen) days. DX E11.9 6 each 6  . Dulaglutide 0.75 MG/0.5ML SOPN Inject 0.75 mg into the skin once a week. 0.5 mL 4  . Ergocalciferol (VITAMIN D2 PO) Take 1 tablet by mouth daily.    .Marland KitchenglipiZIDE (GLUCOTROL XL) 10 MG 24 hr tablet Take 1 tablet (10 mg total) by  mouth daily with breakfast. 90 tablet 3  . hydrOXYzine (ATARAX/VISTARIL) 50 MG tablet Take 1 tablet (50 mg total) by mouth every 8 (eight) hours as needed for itching. 270 tablet 1  . lactulose (CHRONULAC) 10 GM/15ML solution Take 40 mLs (26.65 g total) by mouth 2 (two) times daily. QS 3 mo. 7200 mL 0  . metFORMIN (GLUCOPHAGE) 1000 MG tablet Take 1 tablet (1,000 mg total) by mouth 2 (two) times daily with a meal. 180 tablet 3  . montelukast (SINGULAIR) 10 MG tablet Take 10 mg by mouth at bedtime.    . pantoprazole (PROTONIX) 40 MG tablet Take 40 mg by mouth daily.    . rifaximin (XIFAXAN) 550 MG TABS tablet Take 1 tablet (550 mg total) by mouth 2 (two) times daily. 60 tablet 1  . spironolactone (ALDACTONE) 100 MG tablet Take 100 mg by mouth daily.    . temazepam (RESTORIL) 15 MG capsule Take 1 capsule (15 mg total) by mouth at bedtime. 30 capsule 1   No facility-administered medications prior to visit.      Per HPI unless specifically indicated in ROS section below Review of Systems Objective:    BP 136/62 (BP Location: Left Arm, Patient Position: Sitting, Cuff Size: Large)   Pulse 84   Temp 98.6 F (37 C) (Temporal)   Ht  5' 7.75" (1.721 m)   Wt 296 lb 5 oz (134.4 kg)   SpO2 96%   BMI 45.39 kg/m   Wt Readings from Last 3 Encounters:  05/08/19 296 lb 5 oz (134.4 kg)  04/22/19 (!) 300 lb 4 oz (136.2 kg)  03/19/19 (!) 300 lb 12 oz (136.4 kg)    Physical Exam Vitals signs and nursing note reviewed.  Constitutional:      General: He is not in acute distress.    Appearance: Normal appearance. He is obese. He is not ill-appearing.  HENT:     Head: Normocephalic and atraumatic.  Musculoskeletal: Normal range of motion.        General: No swelling or tenderness.     Comments:  No pain midline spine No paraspinous mm tenderness Mild discomfort to palpation L lower back Neg SLR bilaterally. No pain with int/ext rotation at hip. Neg FABER. No pain at SIJ, GTB or sciatic notch  bilaterally.   Skin:    General: Skin is warm and dry.     Findings: No erythema or rash.  Neurological:     Mental Status: He is alert.     Comments: Diminished DTRs bilaterally. 5/5 strength BLE       Results for orders placed or performed in visit on 04/22/19  Comprehensive metabolic panel  Result Value Ref Range   Sodium 136 135 - 145 mEq/L   Potassium 4.5 3.5 - 5.1 mEq/L   Chloride 101 96 - 112 mEq/L   CO2 26 19 - 32 mEq/L   Glucose, Bld 169 (H) 70 - 99 mg/dL   BUN 17 6 - 23 mg/dL   Creatinine, Ser 0.93 0.40 - 1.50 mg/dL   Total Bilirubin 0.7 0.2 - 1.2 mg/dL   Alkaline Phosphatase 134 (H) 39 - 117 U/L   AST 20 0 - 37 U/L   ALT 15 0 - 53 U/L   Total Protein 6.8 6.0 - 8.3 g/dL   Albumin 3.7 3.5 - 5.2 g/dL   Calcium 9.6 8.4 - 10.5 mg/dL   GFR 80.69 >60.00 mL/min  CBC with Differential/Platelet  Result Value Ref Range   WBC 4.3 4.0 - 10.5 K/uL   RBC 4.51 4.22 - 5.81 Mil/uL   Hemoglobin 11.8 (L) 13.0 - 17.0 g/dL   HCT 36.5 (L) 39.0 - 52.0 %   MCV 80.8 78.0 - 100.0 fl   MCHC 32.3 30.0 - 36.0 g/dL   RDW 18.2 (H) 11.5 - 15.5 %   Platelets 142.0 (L) 150.0 - 400.0 K/uL   Neutrophils Relative % 62.6 43.0 - 77.0 %   Lymphocytes Relative 21.9 12.0 - 46.0 %   Monocytes Relative 12.1 (H) 3.0 - 12.0 %   Eosinophils Relative 2.7 0.0 - 5.0 %   Basophils Relative 0.7 0.0 - 3.0 %   Neutro Abs 2.7 1.4 - 7.7 K/uL   Lymphs Abs 0.9 0.7 - 4.0 K/uL   Monocytes Absolute 0.5 0.1 - 1.0 K/uL   Eosinophils Absolute 0.1 0.0 - 0.7 K/uL   Basophils Absolute 0.0 0.0 - 0.1 K/uL  POCT Urinalysis Dipstick (Automated)  Result Value Ref Range   Color, UA light yellow    Clarity, UA clear    Glucose, UA Positive (A) Negative   Bilirubin, UA negative    Ketones, UA negative    Spec Grav, UA 1.015 1.010 - 1.025   Blood, UA negative    pH, UA 6.0 5.0 - 8.0   Protein, UA Negative Negative  Urobilinogen, UA 0.2 0.2 or 1.0 E.U./dL   Nitrite, UA negative    Leukocytes, UA Negative Negative   CT  Abdomen Pelvis W Contrast CLINICAL DATA:  Pt states he has LLQ pain with lower back pain. Pt states the pain has been on going for past 2-3 months. He states the pain shoots across his back. Hx of hernia surgery with mesh. IUD placement for liver. NO NVD. Hx of prostate .*comment was truncated*^123m OMNIPAQUE IOHEXOL 300 MG/ML SOLNAbd pain, diverticulitis suspected L side abd pain, cirrhosis, [place]  EXAM: CT ABDOMEN AND PELVIS WITH CONTRAST  TECHNIQUE: Multidetector CT imaging of the abdomen and pelvis was performed using the standard protocol following bolus administration of intravenous contrast.  CONTRAST:  1037mOMNIPAQUE IOHEXOL 300 MG/ML  SOLN  COMPARISON:  Abdominal ultrasound 04/28/2019  FINDINGS: Lower chest: Lung bases are clear.  Hepatobiliary: Tips shunt in the RIGHT hepatic lobe. Liver has a nodular contour. Portal veins are patent. No ascites.  Pancreas: Pancreas is normal. No ductal dilatation. No pancreatic inflammation.  Spleen: Spleen is mildly enlarged measures 16.7 x 8.5 x 15.0 cm  Adrenals/urinary tract: Adrenal glands and kidneys are normal. The ureters and bladder normal.  Stomach/Bowel: Stomach, small bowel, appendix, and cecum are normal. The colon and rectosigmoid colon are normal.  Vascular/Lymphatic: Abdominal aorta is normal caliber. No periportal or retroperitoneal adenopathy. No pelvic adenopathy.  Reproductive: Post prostatectomy. A penile prosthetic reservoir in the LEFT lower quadrant.  Other: No inguinal hernia.  No ventral hernia.  Musculoskeletal: No aggressive osseous lesion.  IMPRESSION: 1. No explanation for LEFT-sided pain. Penile prosthetic reservoir in LEFT lower quadrant. No complicating features. 2. No evidence of diverticulitis. 3. Tip shunt in the RIGHT hepatic lobe without complicating features. 4. Morphologic changes of cirrhosis and mild splenomegaly. No ascites.  Electronically Signed   By: StSuzy Bouchard.D.   On: 05/06/2019 15:24   Assessment & Plan:   Problem List Items Addressed This Visit    Left low back pain    Ongoing over 1+ month now. Reviewed recent workup - reassuring GI eval. Today describing ongoing left lower back pain with some radiation to LLQ and groin. Anticipate lumbar radiculopathy vs L lateral lower back strain. Will treat supportively with heating pad, gentle stretching (lower back exercises provided today), continued tylenol, and add low frequency robaxin muscle relaxant PRN. Sedation precautions reviewed with patient. If no better with above, discussed lumbar films as next step.       Relevant Medications   methocarbamol (ROBAXIN) 500 MG tablet       Meds ordered this encounter  Medications  . methocarbamol (ROBAXIN) 500 MG tablet    Sig: Take 1 tablet (500 mg total) by mouth 3 (three) times daily as needed for muscle spasms (sedation precautions).    Dispense:  30 tablet    Refill:  0   No orders of the defined types were placed in this encounter.   Follow up plan: Return if symptoms worsen or fail to improve.  JaRia BushMD

## 2019-05-08 NOTE — Assessment & Plan Note (Addendum)
Ongoing over 1+ month now. Reviewed recent workup - reassuring GI eval. Today describing ongoing left lower back pain with some radiation to LLQ and groin. Anticipate lumbar radiculopathy vs L lateral lower back strain. Will treat supportively with heating pad, gentle stretching (lower back exercises provided today), continued tylenol, and add low frequency robaxin muscle relaxant PRN. Sedation precautions reviewed with patient. If no better with above, discussed lumbar films as next step.

## 2019-05-16 ENCOUNTER — Other Ambulatory Visit: Payer: Self-pay

## 2019-05-16 DIAGNOSIS — K219 Gastro-esophageal reflux disease without esophagitis: Secondary | ICD-10-CM

## 2019-05-16 MED ORDER — PANTOPRAZOLE SODIUM 40 MG PO TBEC: 40.0000 mg | DELAYED_RELEASE_TABLET | Freq: Every day | ORAL | 0 refills | Status: DC

## 2019-05-16 NOTE — Telephone Encounter (Signed)
Noted.  Refill sent to pharmacy.

## 2019-05-16 NOTE — Telephone Encounter (Signed)
Pt called in requesting 90 day supply of Protonix be sent to [facility], this medication has never been filled by Dr Einar Pheasant...Marland Kitchen please advise

## 2019-07-01 ENCOUNTER — Other Ambulatory Visit: Payer: Self-pay | Admitting: Family Medicine

## 2019-07-01 NOTE — Telephone Encounter (Signed)
ERx 

## 2019-07-01 NOTE — Telephone Encounter (Signed)
Name of Medication: Temazepam Name of Pharmacy: [facility] or [facility] Date and Quantity: 04/22/2019 #30 with 1 refill Last Office Visit and Type: 05/08/2019 with Dr Darnell Level for acute visit Next Office Visit and Type: 07/28/2019 with Dr Einar Pheasant for follow up Last Controlled Substance Agreement Date: none Last UDS: none

## 2019-07-21 ENCOUNTER — Other Ambulatory Visit: Payer: Self-pay

## 2019-07-21 DIAGNOSIS — Z20822 Contact with and (suspected) exposure to covid-19: Secondary | ICD-10-CM

## 2019-07-22 LAB — NOVEL CORONAVIRUS, NAA: SARS-CoV-2, NAA: NOT DETECTED

## 2019-07-28 ENCOUNTER — Encounter: Payer: Self-pay | Admitting: Family Medicine

## 2019-07-28 ENCOUNTER — Other Ambulatory Visit (INDEPENDENT_AMBULATORY_CARE_PROVIDER_SITE_OTHER): Payer: Medicare Other

## 2019-07-28 ENCOUNTER — Ambulatory Visit (INDEPENDENT_AMBULATORY_CARE_PROVIDER_SITE_OTHER): Payer: Medicare Other | Admitting: Family Medicine

## 2019-07-28 ENCOUNTER — Ambulatory Visit: Payer: Medicare Other | Admitting: Family Medicine

## 2019-07-28 VITALS — HR 77 | Temp 97.7°F | Wt 294.0 lb

## 2019-07-28 DIAGNOSIS — E1165 Type 2 diabetes mellitus with hyperglycemia: Secondary | ICD-10-CM

## 2019-07-28 DIAGNOSIS — E119 Type 2 diabetes mellitus without complications: Secondary | ICD-10-CM

## 2019-07-28 DIAGNOSIS — M25511 Pain in right shoulder: Secondary | ICD-10-CM | POA: Diagnosis not present

## 2019-07-28 DIAGNOSIS — G4700 Insomnia, unspecified: Secondary | ICD-10-CM

## 2019-07-28 DIAGNOSIS — K7469 Other cirrhosis of liver: Secondary | ICD-10-CM

## 2019-07-28 LAB — POCT GLYCOSYLATED HEMOGLOBIN (HGB A1C): Hemoglobin A1C: 11.6 % — AB (ref 4.0–5.6)

## 2019-07-28 LAB — MICROALBUMIN / CREATININE URINE RATIO
Creatinine,U: 43.4 mg/dL
Microalb Creat Ratio: 1.6 mg/g (ref 0.0–30.0)
Microalb, Ur: <0.7 mg/dL (ref 0.0–1.9)

## 2019-07-28 MED ORDER — METHOCARBAMOL 500 MG PO TABS: 500.0000 mg | ORAL_TABLET | Freq: Three times a day (TID) | ORAL | 0 refills | Status: DC | PRN

## 2019-07-28 MED ORDER — TEMAZEPAM 15 MG PO CAPS: ORAL_CAPSULE | ORAL | 0 refills | Status: DC

## 2019-07-28 NOTE — Patient Instructions (Addendum)
Controlling diabetes is important for improving your overall health.   Lab Results  Component Value Date   HGBA1C 11.1 (H) 03/19/2019    The ideal Hemoglobin A1c is <7%  Continue to try and follow a diabetic diet   Diabetes can lead to: Kidney disease, vision issues, high blood pressure.   Every Year you should have the following:  1) Foot exam 2) Special diabetes eye exam - you did this 3) Urine test to look for signs of kidney disease    If you have kidney disease related to diabetes this is how we can treat it 1) Control your diabetes - first with metformin if tolerated 2) If you have high blood pressure - Take a medication that is an ACE inhibitor or ARB (like lisinopril or losartan)

## 2019-07-28 NOTE — Progress Notes (Signed)
I connected with Wandalee Ferdinand on 07/28/19 at 10:20 AM EST by video and verified that I am speaking with the correct person using two identifiers.   I discussed the limitations, risks, security and privacy concerns of performing an evaluation and management service by video and the availability of in person appointments. I also discussed with the patient that there may be a patient responsible charge related to this service. The patient expressed understanding and agreed to proceed.  Patient location: Home Provider Location: Provider home office Participants: Lesleigh Noe and Wandalee Ferdinand   Subjective:     KOY LAMP is a 68 y.o. male presenting for Diabetes (follow up. Does check his sugar at home usually-average has been around 170 before he ran out of testing supplies), Shoulder Pain (would like to get extension on P.T and refill on Robaxin. Also has a pinched nerve sensation in the back of right shoulder blade.), and Insomnia (would like refill on Temazepam.)     HPI   #Diabetes - taking trulicity - can be as high 170 - typically running 170-200 - diet has not been good - tries to use sugar free things and low calorie - wife won't let him forget medication -   #Shoulder Pain - right - did mentioned the nerve pain to the PT - new nerve pain on the right posterior shoulder - painful if touched - has been present for months - going to therapy for the shoulder pain  - no neck pain, no numbness down the arm -   #Insomnia - did not sleep for a week when he ran out of the temazepam - was initially only taking occasionally - takes it 8 pm and kicks in around 12 am - gets 8 hour of sleep with this medication  Review of Systems  Constitutional: Negative for chills and fever.  Neurological: Negative for weakness and numbness.     Social History   Tobacco Use  Smoking Status Never Smoker  Smokeless Tobacco Never Used        Objective:   BP  Readings from Last 3 Encounters:  05/08/19 136/62  04/22/19 126/68  03/19/19 132/60   Wt Readings from Last 3 Encounters:  07/28/19 294 lb (133.4 kg)  05/08/19 296 lb 5 oz (134.4 kg)  04/22/19 (!) 300 lb 4 oz (136.2 kg)   Pulse 77 Comment: per patient  Temp 97.7 F (36.5 C) Comment: per patient  Wt 294 lb (133.4 kg) Comment: per patient  BMI 45.03 kg/m   Physical Exam Constitutional:      Appearance: Normal appearance. He is not ill-appearing.  HENT:     Head: Normocephalic and atraumatic.     Right Ear: External ear normal.     Left Ear: External ear normal.  Eyes:     Conjunctiva/sclera: Conjunctivae normal.  Pulmonary:     Effort: Pulmonary effort is normal. No respiratory distress.  Neurological:     Mental Status: He is alert. Mental status is at baseline.  Psychiatric:        Mood and Affect: Mood normal.        Behavior: Behavior normal.        Thought Content: Thought content normal.        Judgment: Judgment normal.    Lab Results  Component Value Date   HGBA1C 11.6 (A) 07/28/2019         Assessment & Plan:   Problem List Items Addressed This Visit  Digestive   Cirrhosis of liver ([place])    Stable. Reviewed risk of hydroxyzine and increased cognitive decline. Pt still feels he need this medication        Endocrine   Poorly controlled type 2 diabetes mellitus ([place]) - Primary    Discussed elevated HgbA1c pt is adherent to medication but not with diet. Expressed importance of following diet and if not better controlled at next check will need to do insulin. Pt not interested in insulin at this time. Up to date on eye exam. Will get urine today. Foot exam at follow-up visit.         Other   Acute pain of right shoulder    Pt currently in PT but still with symptoms. Now with some nerve pain. Unable to do exam today due to virtual visit, but recommended continued PT and return if no improvement in 1 month. No radiating arm symptoms to suggest neck  pathology.       Relevant Medications   methocarbamol (ROBAXIN) 500 MG tablet   Other Relevant Orders   Ambulatory referral to Physical Therapy   Insomnia    Reviewed sedation risk. Pt unable to sleep w/o medication and significant improvement on medication.       Relevant Medications   temazepam (RESTORIL) 15 MG capsule       Return in about 3 months (around 10/28/2019).  Lesleigh Noe, MD

## 2019-07-28 NOTE — Assessment & Plan Note (Signed)
Reviewed sedation risk. Pt unable to sleep w/o medication and significant improvement on medication.

## 2019-07-28 NOTE — Assessment & Plan Note (Signed)
Stable. Reviewed risk of hydroxyzine and increased cognitive decline. Pt still feels he need this medication

## 2019-07-28 NOTE — Assessment & Plan Note (Addendum)
Discussed elevated HgbA1c pt is adherent to medication but not with diet. Expressed importance of following diet and if not better controlled at next check will need to do insulin. Pt not interested in insulin at this time. Up to date on eye exam. Will get urine today. Foot exam at follow-up visit.

## 2019-07-28 NOTE — Assessment & Plan Note (Signed)
Pt currently in PT but still with symptoms. Now with some nerve pain. Unable to do exam today due to virtual visit, but recommended continued PT and return if no improvement in 1 month. No radiating arm symptoms to suggest neck pathology.

## 2019-08-22 ENCOUNTER — Other Ambulatory Visit: Payer: Self-pay

## 2019-08-22 DIAGNOSIS — K219 Gastro-esophageal reflux disease without esophagitis: Secondary | ICD-10-CM

## 2019-08-22 MED ORDER — PANTOPRAZOLE SODIUM 40 MG PO TBEC: 40.0000 mg | DELAYED_RELEASE_TABLET | Freq: Every day | ORAL | 2 refills | Status: DC

## 2019-08-27 ENCOUNTER — Other Ambulatory Visit: Payer: Self-pay

## 2019-08-27 DIAGNOSIS — Z20822 Contact with and (suspected) exposure to covid-19: Secondary | ICD-10-CM

## 2019-08-29 LAB — NOVEL CORONAVIRUS, NAA: SARS-CoV-2, NAA: NOT DETECTED

## 2019-09-01 ENCOUNTER — Other Ambulatory Visit: Payer: Self-pay

## 2019-09-01 ENCOUNTER — Telehealth: Payer: Self-pay

## 2019-09-01 DIAGNOSIS — M25511 Pain in right shoulder: Secondary | ICD-10-CM

## 2019-09-01 MED ORDER — METHOCARBAMOL 500 MG PO TABS: 500.0000 mg | ORAL_TABLET | Freq: Three times a day (TID) | ORAL | 0 refills | Status: DC | PRN

## 2019-09-01 NOTE — Telephone Encounter (Signed)
[name] - Client TELEPHONE ADVICE RECORD [facility] Patient Name: Adam Duncan [facility] Gender: Male DOB: 25-Sep-1950 Age: 68 Y 9 M 27 D Return Phone Number: [phone] (Primary) Address: City/State/Zip: [place] [address] Client [name] - Client Client Site [place] Physician Waunita Schooner- MD Contact Type Call Who Is Calling Patient / Member / Family / Caregiver Call Type Triage / Clinical Relationship To Patient Self Return Phone Number [phone] (Primary) Chief Complaint Back Pain - General Reason for Call Medication Question / Request Initial Comment Caller states he has lower back pain that shoots down his leg. It's more severe and more often. He needs muscle relaxer called in. Translation No Nurse Assessment Guidelines Guideline Title Affirmed Question Affirmed Notes Nurse Date/Time (Eastern Time) Disp. Time Eilene Ghazi Time) Disposition Final User 08/30/2019 10:31:09 AM Send To Nurse Silvio Pate, RN, Samantha 08/30/2019 11:13:28 AM Attempt made - message left Laurena Bering, RN, Helene Kelp 08/30/2019 12:33:46 PM FINAL ATTEMPT MADE - message left

## 2019-09-01 NOTE — Telephone Encounter (Signed)
Patient left message earlier on triage line asking to have a call back.  I called and had to leave a message to call us back. Per DPR ok to leave detailed message on his number. I did leave a message stating that if he was calling about his muscle relaxer medication than it was sent in to the pharmacy earlier today already.

## 2019-09-01 NOTE — Telephone Encounter (Signed)
Pt last got methocarbamol 500 mg # 30 on 07/28/19 to [facility] [address].

## 2019-09-21 ENCOUNTER — Other Ambulatory Visit: Payer: Self-pay | Admitting: Family Medicine

## 2019-09-21 DIAGNOSIS — G4700 Insomnia, unspecified: Secondary | ICD-10-CM

## 2019-09-22 ENCOUNTER — Other Ambulatory Visit: Payer: Self-pay | Admitting: Family Medicine

## 2019-09-22 DIAGNOSIS — E119 Type 2 diabetes mellitus without complications: Secondary | ICD-10-CM

## 2019-09-22 NOTE — Telephone Encounter (Signed)
Last filled on 07/28/2019 #30 with 0 refill  LOV 07/28/2019-virtual, follow up. No future appointments

## 2019-09-30 ENCOUNTER — Other Ambulatory Visit: Payer: Self-pay | Admitting: Family Medicine

## 2019-09-30 ENCOUNTER — Telehealth: Payer: Self-pay | Admitting: *Deleted

## 2019-09-30 DIAGNOSIS — E119 Type 2 diabetes mellitus without complications: Secondary | ICD-10-CM

## 2019-09-30 MED ORDER — TRULICITY 0.75 MG/0.5ML ~~LOC~~ SOAJ: SUBCUTANEOUS | 3 refills | Status: DC

## 2019-09-30 NOTE — Telephone Encounter (Signed)
90day supply sent.

## 2019-09-30 NOTE — Telephone Encounter (Signed)
Dawn nurse with [employer] left a voicemail stating that patient is having issues getting his Trulicity. Dawn stated that on 09/22/19 a script was sent in for a 30 day supply. Dawn stated that his insurance plan requires a 90 day supply on all diabetic medications. Dawn stated that patient has been out of his medication for a while because of the cost. Arrie Aran is requesting that a new script be sent to [facility] for a 90 day supply of his Trulicity so his plan will cover it. Dawn's telephone number -was unclear on the voicemail

## 2019-10-02 ENCOUNTER — Other Ambulatory Visit: Payer: Self-pay | Admitting: Family Medicine

## 2019-10-02 DIAGNOSIS — M1A9XX0 Chronic gout, unspecified, without tophus (tophi): Secondary | ICD-10-CM

## 2019-10-02 NOTE — Telephone Encounter (Signed)
Spoke with patient about scheduling follow up appointment with Dr Einar Pheasant in February. Patient will call back to schedule this, he was driving when I called

## 2019-10-27 ENCOUNTER — Ambulatory Visit: Payer: Medicare Other | Attending: Internal Medicine

## 2019-10-27 DIAGNOSIS — Z23 Encounter for immunization: Secondary | ICD-10-CM | POA: Insufficient documentation

## 2019-10-27 NOTE — Progress Notes (Signed)
   Covid-19 Vaccination Clinic  Name:  Adam Duncan    MRN: [id number] DOB: July 10, 1951  10/27/2019  Mr. Adam Duncan was observed post Covid-19 immunization for 15 minutes without incidence. He was provided with Vaccine Information Sheet and instruction to access the V-Safe system.   Mr. Adam Duncan was instructed to call 911 with any severe reactions post vaccine: Marland Kitchen Difficulty breathing  . Swelling of your face and throat  . A fast heartbeat  . A bad rash all over your body  . Dizziness and weakness    Immunizations Administered    Name Date Dose VIS Date Route   Pfizer COVID-19 Vaccine 10/27/2019 10:28 AM 0.3 mL 08/29/2019 Intramuscular   Manufacturer: [place]   Lot: [id number]   [place]: [id number]

## 2019-10-29 ENCOUNTER — Encounter: Payer: Self-pay | Admitting: Family Medicine

## 2019-10-29 ENCOUNTER — Ambulatory Visit (INDEPENDENT_AMBULATORY_CARE_PROVIDER_SITE_OTHER): Payer: Medicare Other | Admitting: Family Medicine

## 2019-10-29 ENCOUNTER — Other Ambulatory Visit: Payer: Self-pay

## 2019-10-29 VITALS — BP 136/62 | HR 82 | Temp 98.3°F | Resp 18 | Ht 67.75 in | Wt 290.5 lb

## 2019-10-29 DIAGNOSIS — K7469 Other cirrhosis of liver: Secondary | ICD-10-CM | POA: Diagnosis not present

## 2019-10-29 DIAGNOSIS — Z95828 Presence of other vascular implants and grafts: Secondary | ICD-10-CM | POA: Insufficient documentation

## 2019-10-29 DIAGNOSIS — E1165 Type 2 diabetes mellitus with hyperglycemia: Secondary | ICD-10-CM | POA: Diagnosis not present

## 2019-10-29 DIAGNOSIS — M25511 Pain in right shoulder: Secondary | ICD-10-CM | POA: Diagnosis not present

## 2019-10-29 DIAGNOSIS — E119 Type 2 diabetes mellitus without complications: Secondary | ICD-10-CM | POA: Diagnosis not present

## 2019-10-29 LAB — POCT GLYCOSYLATED HEMOGLOBIN (HGB A1C): Hemoglobin A1C: 8.7 % — AB (ref 4.0–5.6)

## 2019-10-29 MED ORDER — TRULICITY 1.5 MG/0.5ML ~~LOC~~ SOAJ: 1.5000 mg | SUBCUTANEOUS | 3 refills | Status: DC

## 2019-10-29 MED ORDER — METHOCARBAMOL 500 MG PO TABS: 500.0000 mg | ORAL_TABLET | Freq: Three times a day (TID) | ORAL | 0 refills | Status: DC | PRN

## 2019-10-29 NOTE — Assessment & Plan Note (Signed)
Significant improvement but still elevated. Will increase Trulicity and pt will continue healthy diet. Return in 3 months. Normal foot exam today

## 2019-10-29 NOTE — Assessment & Plan Note (Signed)
Seeing pt w/ improvement, but would like refill of muscle relaxer.

## 2019-10-29 NOTE — Assessment & Plan Note (Signed)
Pt reports that previously was being evaluated q30month. Will refer to hepatology. No symptom concerns today, doing well.

## 2019-10-29 NOTE — Patient Instructions (Addendum)
To finish your current Trulicity supply - inject 2 pens per week on the same day - then pick up new prescription -- which will be higher dose  Referral to liver specialist

## 2019-10-29 NOTE — Progress Notes (Signed)
Subjective:     Adam Duncan is a 69 y.o. male presenting for Diabetes (follow up)     HPI   #Diabetes Currently taking glipizide, trulicity, metformin  -- could increase Trulicity  Using medications without difficulties: No Hypoglycemic episodes:No  Hyperglycemic episodes:Yes  Feet problems:No   Blood Sugars averaging: 130-160s - over the last few weeks  Just started a food management program in January - cut out all bread, sugar, soda, doing whole grain bread 1 slice per day -- feels full on this new diet - eating lots of veggies/fruit/proteins  Last HgbA1c:  Lab Results  Component Value Date   HGBA1C 8.7 (A) 10/29/2019    Diabetes Health Maintenance Due:    Diabetes Health Maintenance Due  Topic Date Due  . FOOT EXAM  11/02/1960  . OPHTHALMOLOGY EXAM  11/26/2019  . HEMOGLOBIN A1C  04/27/2020  . 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 Discussed that insulin would be needed if no improvement  Social History   Tobacco Use  Smoking Status Never Smoker  Smokeless Tobacco Never Used        Objective:    BP Readings from Last 3 Encounters:  10/29/19 136/62  05/08/19 136/62  04/22/19 126/68   Wt Readings from Last 3 Encounters:  10/29/19 290 lb 8 oz (131.8 kg)  07/28/19 294 lb (133.4 kg)  05/08/19 296 lb 5 oz (134.4 kg)    BP 136/62   Pulse 82   Temp 98.3 F (36.8 C)   Resp 18   Ht 5' 7.75" (1.721 m)   Wt 290 lb 8 oz (131.8 kg)   SpO2 98%   BMI 44.50 kg/m    Physical Exam Constitutional:      Appearance: Normal appearance. He is obese. He is not ill-appearing or diaphoretic.  HENT:     Right Ear: External ear normal.     Left Ear: External ear normal.     Nose: Nose normal.  Eyes:     General: No scleral icterus.    Extraocular Movements: Extraocular movements intact.   Conjunctiva/sclera: Conjunctivae normal.  Cardiovascular:     Rate and Rhythm: Normal rate and regular rhythm.     Heart sounds: Murmur present.  Pulmonary:     Effort: Pulmonary effort is normal. No respiratory distress.     Breath sounds: Normal breath sounds. No wheezing.  Musculoskeletal:     Cervical back: Neck supple.  Skin:    General: Skin is warm and dry.  Neurological:     Mental Status: He is alert. Mental status is at baseline.  Psychiatric:        Mood and Affect: Mood normal.           Assessment & Plan:   Problem List Items Addressed This Visit      Digestive   Cirrhosis of liver ([place])   Relevant Orders   Amb Referral to Hepatology     Endocrine   Poorly controlled type 2 diabetes mellitus ([place]) - Primary    Significant improvement but still elevated. Will increase Trulicity and pt will continue healthy diet. Return in 3 months. Normal foot exam today      Relevant Medications   Dulaglutide (TRULICITY) 1.5 DZ/3.2DJ SOPN     Other   Acute pain of right shoulder  Seeing pt w/ improvement, but would like refill of muscle relaxer.       Relevant Medications   methocarbamol (ROBAXIN) 500 MG tablet   S/P TIPS (transjugular intrahepatic portosystemic shunt)    Pt reports that previously was being evaluated q53month. Will refer to hepatology. No symptom concerns today, doing well.       Relevant Orders   Amb Referral to Hepatology    Other Visit Diagnoses    Controlled type 2 diabetes mellitus without complication, without long-term current use of insulin (HCC)       Relevant Medications   Dulaglutide (TRULICITY) 1.5 MSK/8.7GOSOPN   Other Relevant Orders   POCT glycosylated hemoglobin (Hb A1C) (Completed)       Return in about 3 months (around 01/26/2020).  JLesleigh Noe MD

## 2019-11-19 ENCOUNTER — Ambulatory Visit: Payer: Medicare Other | Attending: Internal Medicine

## 2019-11-19 DIAGNOSIS — Z23 Encounter for immunization: Secondary | ICD-10-CM | POA: Insufficient documentation

## 2019-11-19 NOTE — Progress Notes (Signed)
   Covid-19 Vaccination Clinic  Name:  MATHIEU SCHLOEMER    MRN: [id number] DOB: 01-Sep-1951  11/19/2019  Mr. Meneely was observed post Covid-19 immunization for 15 minutes without incident. He was provided with Vaccine Information Sheet and instruction to access the V-Safe system.   Mr. Swallows was instructed to call 911 with any severe reactions post vaccine: Marland Kitchen Difficulty breathing  . Swelling of face and throat  . A fast heartbeat  . A bad rash all over body  . Dizziness and weakness   Immunizations Administered    Name Date Dose VIS Date Route   Pfizer COVID-19 Vaccine 11/19/2019 10:42 AM 0.3 mL 08/29/2019 Intramuscular   Manufacturer: [place]   Lot: [id number]   [place]: [id number]

## 2019-12-01 ENCOUNTER — Other Ambulatory Visit: Payer: Self-pay | Admitting: Family Medicine

## 2019-12-01 DIAGNOSIS — L299 Pruritus, unspecified: Secondary | ICD-10-CM

## 2019-12-01 DIAGNOSIS — K7469 Other cirrhosis of liver: Secondary | ICD-10-CM

## 2019-12-01 NOTE — Telephone Encounter (Signed)
Last filled on 03/19/2019 #270 with 1 refill  LOV 10/29/19 follow up visit Next appointment on 01/28/20 follow up visit

## 2019-12-08 LAB — HM DIABETES EYE EXAM

## 2019-12-10 ENCOUNTER — Encounter: Payer: Self-pay | Admitting: Optometrist

## 2019-12-26 ENCOUNTER — Other Ambulatory Visit: Payer: Self-pay | Admitting: Family Medicine

## 2019-12-26 NOTE — Telephone Encounter (Signed)
Last filled on 10/01/19 #5400 ml with 1 refill LOV 10/29/19 and future appointment on 01/28/20

## 2019-12-31 ENCOUNTER — Other Ambulatory Visit: Payer: Self-pay

## 2019-12-31 ENCOUNTER — Ambulatory Visit (INDEPENDENT_AMBULATORY_CARE_PROVIDER_SITE_OTHER): Payer: Medicare Other | Admitting: Gastroenterology

## 2019-12-31 ENCOUNTER — Other Ambulatory Visit: Payer: Self-pay | Admitting: Family Medicine

## 2019-12-31 VITALS — BP 125/73 | HR 76 | Temp 98.3°F | Ht 67.75 in | Wt 291.6 lb

## 2019-12-31 DIAGNOSIS — L290 Pruritus ani: Secondary | ICD-10-CM | POA: Diagnosis not present

## 2019-12-31 DIAGNOSIS — K7581 Nonalcoholic steatohepatitis (NASH): Secondary | ICD-10-CM | POA: Diagnosis not present

## 2019-12-31 DIAGNOSIS — K7460 Unspecified cirrhosis of liver: Secondary | ICD-10-CM

## 2019-12-31 DIAGNOSIS — Z86010 Personal history of colonic polyps: Secondary | ICD-10-CM

## 2019-12-31 DIAGNOSIS — M1A9XX0 Chronic gout, unspecified, without tophus (tophi): Secondary | ICD-10-CM

## 2019-12-31 NOTE — Progress Notes (Signed)
Jonathon Bellows MD, MRCP(U.K) [address]  [place]  [place], [place] [address]  Main: [phone]  Fax: [phone]   Gastroenterology Consultation  Referring Provider:     Lesleigh Noe, MD Primary Care Physician:  Lesleigh Noe, MD Primary Gastroenterologist:  Dr. Jonathon Bellows  Reason for Consultation:    Liver cirrhosis        HPI:   Adam Duncan is a 69 y.o. y/o male referred for consultation & management  by Dr. Einar Pheasant, Jobe Marker, MD.    I do not have any old GI notes.  No recent labs.  He recently moved from [place] area.  Diagnosed with cirrhosis of liver and ascites in the year 2000 in 2015 he underwent a TIPS procedure for significant ascites and since then has had no reaccumulation.  No history of variceal bleeding or hepatic encephalopathy.  His weight has been stable.  Denies any excess alcohol use.  Denies having any viral hepatitis in the past.  History of anal discomfort in terms of itching due to excessive cleaning.  He has a history of colon polyps in the past due for screening colonoscopy.   04/28/2019: Ultrasound abdomen demonstrates hepatic cirrhosis with moderate to severe splenomegaly. 04/28/2019 ultrasound pelvis negative ultrasound 05/06/2019 CT scan of the abdomen pelvis with contrast demonstrates the shunt in the right hepatic lobe.  Cirrhosis and splenomegaly.   Past Medical History:  Diagnosis Date  . Allergy   . Cancer of prostate ([place])   . 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 Diabetes mellitus without complication ([place])   . GERD (gastroesophageal reflux disease)   . Gout   . Hypertension   . Insomnia   . Skin cancer     Past Surgical History:  Procedure Laterality Date  . CATARACT EXTRACTION    . HERNIA REPAIR  2015   x 2 and mesh was put in   . NASAL SEPTUM SURGERY    . PROSTATE SURGERY    . TIPS PROCEDURE    . TONSILLECTOMY AND ADENOIDECTOMY      Prior to Admission medications   Medication Sig Start Date End Date  Taking? Authorizing Provider  acetaminophen (TYLENOL) 500 MG tablet Take 1 tablet (500 mg total) by mouth every 8 (eight) hours as needed. 04/22/19   Ria Bush, MD  allopurinol (ZYLOPRIM) 100 MG tablet TAKE 1 TABLET(100 MG) BY MOUTH Duncan 12/31/19   Lesleigh Noe, MD  Ascorbic Acid (VITAMIN C) 1000 MG tablet Take 1,000 mg by mouth Duncan.    [provider]  B Complex Vitamins (VITAMIN B COMPLEX PO) Take by mouth.    [provider]  Continuous Blood Gluc Sensor (FREESTYLE LIBRE 14 DAY SENSOR) MISC 1 each by Does not apply route every 14 (fourteen) days. DX E11.9 03/19/19   Lesleigh Noe, MD  Dulaglutide (TRULICITY) 1.5 PR/9.1MB SOPN Inject 1.5 mg into the skin once a week. 10/29/19   Lesleigh Noe, MD  Ergocalciferol (VITAMIN D2 PO) Take 1 tablet by mouth Duncan.    [provider]  glipiZIDE (GLUCOTROL XL) 10 MG 24 hr tablet Take 1 tablet (10 mg total) by mouth Duncan with breakfast. 03/27/19   Lesleigh Noe, MD  hydrOXYzine (ATARAX/VISTARIL) 50 MG tablet TAKE 1 TABLET BY MOUTH EVERY 8 HOURS AS NEEDED FOR ITCHING 12/01/19   Lesleigh Noe, MD  lactulose (CHRONULAC) 10 GM/15ML solution TAKE 40 MILLILITERS BY MOUTH TWICE A DAY 12/26/19  Lesleigh Noe, MD  levocetirizine (XYZAL) 5 MG tablet Xyzal 5 mg tablet  Take 1 tablet every day by oral route.    [provider]  metFORMIN (GLUCOPHAGE) 1000 MG tablet Take 1 tablet (1,000 mg total) by mouth 2 (two) times Duncan with a meal. 03/27/19   Lesleigh Noe, MD  methocarbamol (ROBAXIN) 500 MG tablet Take 1 tablet (500 mg total) by mouth 3 (three) times Duncan as needed for muscle spasms (sedation precautions). Patient not taking: Reported on 12/31/2019 10/29/19   Lesleigh Noe, MD  montelukast (SINGULAIR) 10 MG tablet Take 10 mg by mouth at bedtime.    [provider]  pantoprazole (PROTONIX) 40 MG tablet Take 1 tablet (40 mg total) by mouth Duncan. 08/22/19   Lesleigh Noe, MD  rifaximin (XIFAXAN) 550 MG  TABS tablet Take 1 tablet (550 mg total) by mouth 2 (two) times Duncan. 04/22/19   Ria Bush, MD  spironolactone (ALDACTONE) 100 MG tablet Take 100 mg by mouth Duncan.    [provider]  temazepam (RESTORIL) 15 MG capsule TAKE 1 CAPSULE(15 MG) BY MOUTH AT BEDTIME 09/22/19   Lesleigh Noe, MD  Zinc Sulfate (ZINC 15 PO) Take by mouth.    [provider]  lactulose (CHRONULAC) 10 GM/15ML solution TAKE 30 MLS BY MOUTH TWICE Duncan 10/01/19   Lesleigh Noe, MD    Family History  Problem Relation Age of Onset  . Lymphoma Mother   . Lung cancer Father   . Rheum arthritis Sister   . Irritable bowel syndrome Sister   . COPD Brother   . Cancer Brother        unsure of type  . Cancer Maternal Grandfather        unsure of type  . Cancer Paternal Grandfather        unsure of type  . Arthritis Sister      Social History   Tobacco Use  . Smoking status: Never Smoker  . Smokeless tobacco: Never Used  Substance Use Topics  . Alcohol use: Not Currently  . Drug use: Never    Allergies as of 12/31/2019 - Review Complete 12/31/2019  Allergen Reaction Noted  . Dilaudid [hydromorphone hcl] Nausea And Vomiting 03/19/2019    Review of Systems:    All systems reviewed and negative except where noted in HPI.   Physical Exam:  BP 125/73   Pulse 76   Temp 98.3 F (36.8 C)   Ht 5' 7.75" (1.721 m)   Wt 291 lb 9.6 oz (132.3 kg)   BMI 44.67 kg/m  No LMP for male patient. Psych:  Alert and cooperative. Normal mood and affect. General:   Alert,  Well-developed, well-nourished, pleasant and cooperative in NAD Head:  Normocephalic and atraumatic. Eyes:  Sclera clear, no icterus.   Conjunctiva pink. Ears:  Normal auditory acuity. Lungs:  Respirations even and unlabored.  Clear throughout to auscultation.   No wheezes, crackles, or rhonchi. No acute distress. Heart:  Regular rate and rhythm; no murmurs, clicks, rubs, or gallops. Abdomen:  Normal bowel sounds.  No bruits.   Soft, non-tender and non-distended without masses, hepatosplenomegaly or hernias noted.  No guarding or rebound tenderness.    Neurologic:  Alert and oriented x3;  grossly normal neurologically. Psych:  Alert and cooperative. Normal mood and affect.  Imaging Studies: No results found.  Assessment and Plan:   Adam Duncan is a 69 y.o. y/o male has been referred for liver cirrhosis.  History of TIPS shunt in place.  Clinically no evidence of ascites.  1.  Right upper quadrant ultrasound to screen for [place] 2.  Labs to calculate meld score 3.  Labs to determine immune status to hepatitis A, B, C 4.  EGD to screen for esophageal varices.  Colonoscopy to screen for colon polyps due to personal history of colon polyps. 5.  Low-salt diet 6.  History of possible internal hemorrhoids and anal pruritus.  Will examine under anesthesia in addition provided information on conservative management of hemorrhoids.  Also counseled him about the same. 56.  Obtain old prior GI records   I have discussed alternative options, risks & benefits,  which include, but are not limited to, bleeding, infection, perforation,respiratory complication & drug reaction.  The patient agrees with this plan & written consent will be obtained.    Follow up in 4 months telephone visit  Dr Jonathon Bellows MD,MRCP(U.K)

## 2020-01-01 LAB — CBC WITH DIFFERENTIAL/PLATELET
Basophils Absolute: 0.1 10*3/uL (ref 0.0–0.2)
Basos: 2 %
EOS (ABSOLUTE): 0.1 10*3/uL (ref 0.0–0.4)
Eos: 3 %
Hematocrit: 36.2 % — ABNORMAL LOW (ref 37.5–51.0)
Hemoglobin: 11.8 g/dL — ABNORMAL LOW (ref 13.0–17.7)
Immature Grans (Abs): 0 10*3/uL (ref 0.0–0.1)
Immature Granulocytes: 0 %
Lymphocytes Absolute: 0.7 10*3/uL (ref 0.7–3.1)
Lymphs: 21 %
MCH: 26.3 pg — ABNORMAL LOW (ref 26.6–33.0)
MCHC: 32.6 g/dL (ref 31.5–35.7)
MCV: 81 fL (ref 79–97)
Monocytes Absolute: 0.4 10*3/uL (ref 0.1–0.9)
Monocytes: 10 %
Neutrophils Absolute: 2.2 10*3/uL (ref 1.4–7.0)
Neutrophils: 64 %
Platelets: 116 10*3/uL — ABNORMAL LOW (ref 150–450)
RBC: 4.49 x10E6/uL (ref 4.14–5.80)
RDW: 15.8 % — ABNORMAL HIGH (ref 11.6–15.4)
WBC: 3.4 10*3/uL (ref 3.4–10.8)

## 2020-01-01 LAB — COMPREHENSIVE METABOLIC PANEL
ALT: 17 IU/L (ref 0–44)
AST: 23 IU/L (ref 0–40)
Albumin/Globulin Ratio: 1.5 (ref 1.2–2.2)
Albumin: 3.9 g/dL (ref 3.8–4.8)
Alkaline Phosphatase: 119 IU/L — ABNORMAL HIGH (ref 39–117)
BUN/Creatinine Ratio: 14 (ref 10–24)
BUN: 16 mg/dL (ref 8–27)
Bilirubin Total: 0.8 mg/dL (ref 0.0–1.2)
CO2: 19 mmol/L — ABNORMAL LOW (ref 20–29)
Calcium: 9 mg/dL (ref 8.6–10.2)
Chloride: 100 mmol/L (ref 96–106)
Creatinine, Ser: 1.13 mg/dL (ref 0.76–1.27)
GFR calc Af Amer: 76 mL/min/{1.73_m2} (ref >59)
GFR calc non Af Amer: 66 mL/min/{1.73_m2} (ref >59)
Globulin, Total: 2.6 g/dL (ref 1.5–4.5)
Glucose: 248 mg/dL — ABNORMAL HIGH (ref 65–99)
Potassium: 4.2 mmol/L (ref 3.5–5.2)
Sodium: 136 mmol/L (ref 134–144)
Total Protein: 6.5 g/dL (ref 6.0–8.5)

## 2020-01-01 LAB — HEPATITIS B SURFACE ANTIBODY,QUALITATIVE: Hep B Surface Ab, Qual: NONREACTIVE

## 2020-01-01 LAB — PROTIME-INR
INR: 1.1 (ref 0.9–1.2)
Prothrombin Time: 11.7 s (ref 9.1–12.0)

## 2020-01-01 LAB — HEPATITIS C ANTIBODY: Hep C Virus Ab: <0.1 s/co ratio (ref 0.0–0.9)

## 2020-01-01 LAB — HEPATITIS A ANTIBODY, TOTAL: hep A Total Ab: NEGATIVE

## 2020-01-04 ENCOUNTER — Emergency Department
Admission: EM | Admit: 2020-01-04 | Discharge: 2020-01-04 | Disposition: A | Discharge status: Home / Self Care | Payer: Medicare Other | Attending: Emergency Medicine | Admitting: Emergency Medicine

## 2020-01-04 ENCOUNTER — Emergency Department: Payer: Medicare Other

## 2020-01-04 ENCOUNTER — Other Ambulatory Visit: Payer: Self-pay

## 2020-01-04 DIAGNOSIS — R410 Disorientation, unspecified: Secondary | ICD-10-CM

## 2020-01-04 DIAGNOSIS — K7460 Unspecified cirrhosis of liver: Secondary | ICD-10-CM | POA: Insufficient documentation

## 2020-01-04 DIAGNOSIS — E119 Type 2 diabetes mellitus without complications: Secondary | ICD-10-CM | POA: Insufficient documentation

## 2020-01-04 DIAGNOSIS — Z7984 Long term (current) use of oral hypoglycemic drugs: Secondary | ICD-10-CM | POA: Diagnosis not present

## 2020-01-04 DIAGNOSIS — K7290 Hepatic failure, unspecified without coma: Secondary | ICD-10-CM | POA: Diagnosis not present

## 2020-01-04 DIAGNOSIS — G9349 Other encephalopathy: Secondary | ICD-10-CM | POA: Diagnosis not present

## 2020-01-04 DIAGNOSIS — I10 Essential (primary) hypertension: Secondary | ICD-10-CM | POA: Diagnosis not present

## 2020-01-04 DIAGNOSIS — Z79899 Other long term (current) drug therapy: Secondary | ICD-10-CM | POA: Diagnosis not present

## 2020-01-04 DIAGNOSIS — R4182 Altered mental status, unspecified: Secondary | ICD-10-CM | POA: Diagnosis present

## 2020-01-04 DIAGNOSIS — K7682 Hepatic encephalopathy: Secondary | ICD-10-CM

## 2020-01-04 HISTORY — DX: Disorientation, unspecified: R41.0

## 2020-01-04 LAB — URINALYSIS, COMPLETE (UACMP) WITH MICROSCOPIC
Bacteria, UA: NONE SEEN
Bilirubin Urine: NEGATIVE
Glucose, UA: >=500 mg/dL — AB
Hgb urine dipstick: NEGATIVE
Ketones, ur: NEGATIVE mg/dL
Leukocytes,Ua: NEGATIVE
Nitrite: NEGATIVE
Protein, ur: NEGATIVE mg/dL
Specific Gravity, Urine: 1.021 (ref 1.005–1.030)
Squamous Epithelial / HPF: NONE SEEN (ref 0–5)
pH: 6 (ref 5.0–8.0)

## 2020-01-04 LAB — AMMONIA: Ammonia: 61 umol/L — ABNORMAL HIGH (ref 9–35)

## 2020-01-04 LAB — CBC
HCT: 36 % — ABNORMAL LOW (ref 39.0–52.0)
Hemoglobin: 11.5 g/dL — ABNORMAL LOW (ref 13.0–17.0)
MCH: 26.1 pg (ref 26.0–34.0)
MCHC: 31.9 g/dL (ref 30.0–36.0)
MCV: 81.6 fL (ref 80.0–100.0)
Platelets: 123 10*3/uL — ABNORMAL LOW (ref 150–400)
RBC: 4.41 MIL/uL (ref 4.22–5.81)
RDW: 16.9 % — ABNORMAL HIGH (ref 11.5–15.5)
WBC: 4.5 10*3/uL (ref 4.0–10.5)
nRBC: 0 % (ref 0.0–0.2)

## 2020-01-04 LAB — COMPREHENSIVE METABOLIC PANEL
ALT: 20 U/L (ref 0–44)
AST: 24 U/L (ref 15–41)
Albumin: 3.6 g/dL (ref 3.5–5.0)
Alkaline Phosphatase: 116 U/L (ref 38–126)
Anion gap: 8 (ref 5–15)
BUN: 18 mg/dL (ref 8–23)
CO2: 23 mmol/L (ref 22–32)
Calcium: 8.7 mg/dL — ABNORMAL LOW (ref 8.9–10.3)
Chloride: 106 mmol/L (ref 98–111)
Creatinine, Ser: 1.14 mg/dL (ref 0.61–1.24)
GFR calc Af Amer: >60 mL/min (ref >60)
GFR calc non Af Amer: >60 mL/min (ref >60)
Glucose, Bld: 232 mg/dL — ABNORMAL HIGH (ref 70–99)
Potassium: 4.2 mmol/L (ref 3.5–5.1)
Sodium: 137 mmol/L (ref 135–145)
Total Bilirubin: 0.8 mg/dL (ref 0.3–1.2)
Total Protein: 6.9 g/dL (ref 6.5–8.1)

## 2020-01-04 NOTE — ED Triage Notes (Signed)
Patient arrived via [facility] from home. Patient is normally AOx4 and ambulatory however patient is currently AOx2 and requires 2x Person assist to ambulate. Patient wife at bedside called 911 due to acute onset of confusion this morning. Patient does take Lactulose, patient does have bilateral lower leg edema, patient does have distended and taught abdomen.

## 2020-01-04 NOTE — ED Provider Notes (Signed)
[facility] Emergency Department Provider Note   ____________________________________________    I have reviewed the triage vital signs and the nursing notes.   HISTORY  Chief Complaint Altered mental status    HPI Adam Duncan is a 69 y.o. male with a history of diabetes, cirrhosis of the liver who had a TIPS procedure in 2015 who presents with confusion which started this morning.  Wife reports that patient said that he "did not feel well this morning and then "it descended from there ".  She reports this has happened multiple times in the past and is almost always related to his ammonia level increasing.  He typically then has to go on lactulose and rifaximin.  No fevers reported, no cough.  No neuro deficits.  The patient states that he feels quite well and has no complaints  Past Medical History:  Diagnosis Date  . Acute confusion 01/04/2020  . Allergy   . Cancer of prostate ([place])   . 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 Diabetes mellitus without complication ([place])   . GERD (gastroesophageal reflux disease)   . Gout   . Hypertension   . Insomnia   . Skin cancer     Patient Active Problem List   Diagnosis Date Noted  . S/P TIPS (transjugular intrahepatic portosystemic shunt) 10/29/2019  . Left low back pain 04/22/2019  . Trochanteric bursitis of left hip 04/22/2019  . Insomnia 04/22/2019  . Cardiac murmur 03/26/2019  . Acute pain of right shoulder 03/19/2019  . Itching 03/19/2019  . Poorly controlled type 2 diabetes mellitus ([place])   . GERD (gastroesophageal reflux disease)   . Cirrhosis of liver ([place])   . History of prostate cancer   . Gout     Past Surgical History:  Procedure Laterality Date  . CATARACT EXTRACTION    . HERNIA REPAIR  2015   x 2 and mesh was put in   . NASAL SEPTUM SURGERY    . PROSTATE SURGERY    . TIPS PROCEDURE    . TONSILLECTOMY AND ADENOIDECTOMY      Prior to Admission medications     Medication Sig Start Date End Date Taking? Authorizing Provider  acetaminophen (TYLENOL) 500 MG tablet Take 1 tablet (500 mg total) by mouth every 8 (eight) hours as needed. 04/22/19   Ria Bush, MD  allopurinol (ZYLOPRIM) 100 MG tablet TAKE 1 TABLET(100 MG) BY MOUTH DAILY 12/31/19   Lesleigh Noe, MD  Ascorbic Acid (VITAMIN C) 1000 MG tablet Take 1,000 mg by mouth daily.    [provider]  B Complex Vitamins (VITAMIN B COMPLEX PO) Take by mouth.    [provider]  Continuous Blood Gluc Sensor (FREESTYLE LIBRE 14 DAY SENSOR) MISC 1 each by Does not apply route every 14 (fourteen) days. DX E11.9 03/19/19   Lesleigh Noe, MD  Dulaglutide (TRULICITY) 1.5 SN/0.5LZ SOPN Inject 1.5 mg into the skin once a week. 10/29/19   Lesleigh Noe, MD  Ergocalciferol (VITAMIN D2 PO) Take 1 tablet by mouth daily.    [provider]  glipiZIDE (GLUCOTROL XL) 10 MG 24 hr tablet Take 1 tablet (10 mg total) by mouth daily with breakfast. 03/27/19   Lesleigh Noe, MD  hydrOXYzine (ATARAX/VISTARIL) 50 MG tablet TAKE 1 TABLET BY MOUTH EVERY 8 HOURS AS NEEDED FOR ITCHING 12/01/19   Lesleigh Noe, MD  lactulose (CHRONULAC) 10 GM/15ML solution TAKE 40 MILLILITERS BY MOUTH  TWICE A DAY 12/26/19   Lesleigh Noe, MD  levocetirizine (XYZAL) 5 MG tablet Xyzal 5 mg tablet  Take 1 tablet every day by oral route.    [provider]  metFORMIN (GLUCOPHAGE) 1000 MG tablet Take 1 tablet (1,000 mg total) by mouth 2 (two) times daily with a meal. 03/27/19   Lesleigh Noe, MD  methocarbamol (ROBAXIN) 500 MG tablet Take 1 tablet (500 mg total) by mouth 3 (three) times daily as needed for muscle spasms (sedation precautions). Patient not taking: Reported on 12/31/2019 10/29/19   Lesleigh Noe, MD  montelukast (SINGULAIR) 10 MG tablet Take 10 mg by mouth at bedtime.    [provider]  pantoprazole (PROTONIX) 40 MG tablet Take 1 tablet (40 mg total) by mouth daily. 08/22/19   Lesleigh Noe, MD  rifaximin (XIFAXAN) 550 MG TABS tablet Take 1 tablet (550 mg total) by mouth 2 (two) times daily. 04/22/19   Ria Bush, MD  spironolactone (ALDACTONE) 100 MG tablet Take 100 mg by mouth daily.    [provider]  temazepam (RESTORIL) 15 MG capsule TAKE 1 CAPSULE(15 MG) BY MOUTH AT BEDTIME 09/22/19   Lesleigh Noe, MD  Zinc Sulfate (ZINC 15 PO) Take by mouth.    [provider]  lactulose (CHRONULAC) 10 GM/15ML solution TAKE 30 MLS BY MOUTH TWICE DAILY 10/01/19   Lesleigh Noe, MD     Allergies Dilaudid [hydromorphone hcl]  Family History  Problem Relation Age of Onset  . Lymphoma Mother   . Lung cancer Father   . Rheum arthritis Sister   . Irritable bowel syndrome Sister   . COPD Brother   . Cancer Brother        unsure of type  . Cancer Maternal Grandfather        unsure of type  . Cancer Paternal Grandfather        unsure of type  . Arthritis Sister     Social History Social History   Tobacco Use  . Smoking status: Never Smoker  . Smokeless tobacco: Never Used  Substance Use Topics  . Alcohol use: Not Currently  . Drug use: Never    Review of Systems  Constitutional: No fever/chills Eyes: No visual changes.  ENT: No sore throat. Cardiovascular: Denies chest pain. Respiratory: Denies shortness of breath. Gastrointestinal: No abdominal pain.   Genitourinary: Negative for dysuria. Musculoskeletal: Negative for back pain. Skin: Negative for rash. Neurological: Confusion per wife   ____________________________________________   PHYSICAL EXAM:  VITAL SIGNS: ED Triage Vitals  Enc Vitals Group     BP 01/04/20 1545 (!) 150/64     Pulse Rate 01/04/20 1545 72     Resp 01/04/20 1545 (!) 29     Temp 01/04/20 1600 98 F (36.7 C)     Temp Source 01/04/20 1600 Oral     SpO2 01/04/20 1545 99 %     Weight 01/04/20 1610 132 kg (291 lb 0.1 oz)     Height 01/04/20 1610 1.702 m (5' 7" )     Head Circumference --      Peak Flow --       Pain Score 01/04/20 1610 0     Pain Loc --      Pain Edu? --      Excl. in [place]? --     Constitutional: Alert but disoriented no acute distress Eyes: Conjunctivae are normal.  PERRLA, EOMI Head: Atraumatic. Nose: No congestion/rhinnorhea. Mouth/Throat: Mucous membranes are  moist.   Neck:  Painless ROM Cardiovascular: Normal rate, regular rhythm. Grossly normal heart sounds.  Good peripheral circulation. Respiratory: Normal respiratory effort.  No retractions. Lungs CTAB. Gastrointestinal: Soft, no palpable ascites, not "taut "as noted by nurse, reassuring exam, no tenderness Genitourinary: deferred Musculoskeletal: 1+ edema bilaterally. warm and well perfused Neurologic:  Normal speech and language. No gross focal neurologic deficits are appreciated.  Skin:  Skin is warm, dry and intact. No rash noted. Psychiatric: Mood and affect are normal. Speech and behavior are normal.  ____________________________________________   LABS (all labs ordered are listed, but only abnormal results are displayed)  Labs Reviewed  CBC - Abnormal; Notable for the following components:      Result Value   Hemoglobin 11.5 (*)    HCT 36.0 (*)    RDW 16.9 (*)    Platelets 123 (*)    All other components within normal limits  COMPREHENSIVE METABOLIC PANEL - Abnormal; Notable for the following components:   Glucose, Bld 232 (*)    Calcium 8.7 (*)    All other components within normal limits  URINALYSIS, COMPLETE (UACMP) WITH MICROSCOPIC - Abnormal; Notable for the following components:   Color, Urine YELLOW (*)    APPearance CLEAR (*)    Glucose, UA >=500 (*)    All other components within normal limits  AMMONIA - Abnormal; Notable for the following components:   Ammonia 61 (*)    All other components within normal limits   ____________________________________________  EKG  ED ECG REPORT I, Lavonia Drafts, the attending physician, personally viewed and interpreted this ECG.  Date:  01/04/2020  Rhythm: normal sinus rhythm QRS Axis: normal Intervals: Abnormal ST/T Wave abnormalities: normal Narrative Interpretation: no evidence of acute ischemia  ____________________________________________  RADIOLOGY  Chest x-ray ____________________________________________   PROCEDURES  Procedure(s) performed: No  Procedures   Critical Care performed: No ____________________________________________   INITIAL IMPRESSION / ASSESSMENT AND PLAN / ED COURSE  Pertinent labs & imaging results that were available during my care of the patient were reviewed by me and considered in my medical decision making (see chart for details).  Patient presents with confusion, history of liver cirrhosis status post TIPS 2015.  Review records demonstrate the patient just saw GI for the first time in our area 4 days ago.  Differential includes hepatic encephalopathy, metabolic encephalopathy, not consistent with CVA.  Wife has been very helpful, reports several days ago he had similar episode but this resolved on its own they did not seek care at that time.  Today he seemed slightly more confused but is improved at this time.  She feels confident this is related to his ammonia.  Patient reports he feels quite well.  Exam is overall reassuring besides some mild confusion.  Ammonia pending.  Labs pending.  ----------------------------------------- 5:19 PM on 01/04/2020 -----------------------------------------  Patient's wife states that he seems to be improved significantly.  Lab work is overall reassuring, no evidence of electrolyte abnormalities or infection.  Discussed with his GI Dr. Vicente Males who notes if discharged he will see the patient in 2 days in his office.  Discussed with the patient and his wife, patient is anxious to go home and wife is quite comfortable with discharge.  Patient's ammonia is 61.   will continue lactulose and rifaximin and see GI in 2 days.,  Return precautions  discussed    ____________________________________________   FINAL CLINICAL IMPRESSION(S) / ED DIAGNOSES  Final diagnoses:  Hepatic encephalopathy ([place])  Note:  This document was prepared using Dragon voice recognition software and may include unintentional dictation errors.   Lavonia Drafts, MD 01/04/20 [phone]

## 2020-01-06 ENCOUNTER — Other Ambulatory Visit: Payer: Self-pay

## 2020-01-06 ENCOUNTER — Ambulatory Visit (INDEPENDENT_AMBULATORY_CARE_PROVIDER_SITE_OTHER): Payer: Medicare Other | Admitting: Gastroenterology

## 2020-01-06 VITALS — BP 146/78 | HR 80 | Temp 98.0°F | Ht 67.75 in | Wt 289.4 lb

## 2020-01-06 DIAGNOSIS — K5909 Other constipation: Secondary | ICD-10-CM | POA: Diagnosis not present

## 2020-01-06 DIAGNOSIS — G4729 Other circadian rhythm sleep disorder: Secondary | ICD-10-CM | POA: Diagnosis not present

## 2020-01-06 DIAGNOSIS — Z23 Encounter for immunization: Secondary | ICD-10-CM

## 2020-01-06 DIAGNOSIS — K7290 Hepatic failure, unspecified without coma: Secondary | ICD-10-CM | POA: Diagnosis not present

## 2020-01-06 DIAGNOSIS — K7682 Hepatic encephalopathy: Secondary | ICD-10-CM

## 2020-01-06 MED ORDER — TWINRIX 720-20 ELU-MCG/ML IM SUSP: INTRAMUSCULAR | 2 refills | Status: DC

## 2020-01-06 NOTE — Addendum Note (Signed)
Addended by: Dorethea Clan on: 01/06/2020 04:42 PM   Modules accepted: Orders

## 2020-01-06 NOTE — Progress Notes (Signed)
Adam Bellows MD, MRCP(U.K) [address]  [place]  [place], [place] [address]  Main: [phone]  Fax: [phone]   Primary Care Physician: Lesleigh Noe, MD  Primary Gastroenterologist:  Dr. Jonathon Duncan   Follow-up to recent ER visit  HPI: VIRGLE ARTH is a 69 y.o. 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  He recently moved from [place] area.  Diagnosed with cirrhosis of liver and ascites in the year 2000 in 2015 he underwent a TIPS procedure for significant ascites and since then has had no reaccumulation.  No history of variceal bleeding or hepatic encephalopathy.  His weight has been stable.  Denied any excess alcohol use.  Denies having any viral hepatitis in the past.  History of anal discomfort in terms of itching due to excessive cleaning.  He has a history of colon polyps in the past due for screening colonoscopy.   04/28/2019: Ultrasound abdomen demonstrates hepatic cirrhosis with moderate to severe splenomegaly. 04/28/2019 ultrasound pelvis negative ultrasound 05/06/2019 CT scan of the abdomen pelvis with contrast demonstrates the shunt in the right hepatic lobe.  Cirrhosis and splenomegaly.   Interval history   12/31/2019-01/06/2020  He presented to the emergency room on 01/04/2020 with confusion. 01/04/2020: Glucose greater than 500, serum glucose of 232, AST and ALT normal, creatinine normal.  Hemoglobin 11.5 stable. 12/31/2019 hepatitis A total antibody negative, hepatitis B surface antibody negative INR 1.1  He states that prior to this the past sort of confusion the last when he had was back in 2015.  He takes his lactulose daily along with Xifaxan 550 mg twice daily.  He has 1 bowel movement most days and he has possibly missed bowel movements at times.  He recollects that he takes his Xifaxan regularly but his wife suggest that there is a possibility  that he might have missed doses.  He has been evaluated for sleep apnea as he stays awake at night and sleeps during the day.  The patient truly believes he does not have sleep apnea although he does have risk factors for the same.  Normal color stool.  Current Outpatient Medications  Medication Sig Dispense Refill  . acetaminophen (TYLENOL) 500 MG tablet Take 1 tablet (500 mg total) by mouth every 8 (eight) hours as needed.    Marland Kitchen allopurinol (ZYLOPRIM) 100 MG tablet TAKE 1 TABLET(100 MG) BY MOUTH DAILY 90 tablet 0  . Ascorbic Acid (VITAMIN C) 1000 MG tablet Take 1,000 mg by mouth daily.    . B Complex Vitamins (VITAMIN B COMPLEX PO) Take by mouth.    . Continuous Blood Gluc Sensor (FREESTYLE LIBRE 14 DAY SENSOR) MISC 1 each by Does not apply route every 14 (fourteen) days. DX E11.9 6 each 6  . Dulaglutide (TRULICITY) 1.5 WS/5.6CL SOPN Inject 1.5 mg into the skin once a week. 12 pen 3  . Ergocalciferol (VITAMIN D2 PO) Take 1 tablet by mouth daily.    Marland Kitchen glipiZIDE (GLUCOTROL XL) 10 MG 24 hr tablet Take 1 tablet (10 mg total) by mouth daily with breakfast. 90 tablet 3  . hydrOXYzine (ATARAX/VISTARIL) 50 MG tablet TAKE 1 TABLET BY MOUTH EVERY 8 HOURS AS NEEDED FOR ITCHING 270 tablet 1  . lactulose (CHRONULAC) 10 GM/15ML solution TAKE 40 MILLILITERS BY MOUTH TWICE A DAY 7200 mL 3  . levocetirizine (XYZAL) 5 MG tablet Xyzal 5 mg tablet  Take 1 tablet every day by oral route.    . metFORMIN (GLUCOPHAGE) 1000 MG tablet Take 1 tablet (1,000 mg total) by mouth 2 (two) times daily with a meal. 180 tablet 3  . methocarbamol (ROBAXIN) 500 MG tablet Take 1 tablet (500 mg total) by mouth 3 (three) times daily as needed for muscle spasms (sedation precautions). (Patient not taking: Reported on 12/31/2019) 30 tablet 0  . montelukast (SINGULAIR) 10 MG tablet Take 10 mg by mouth at bedtime.    . pantoprazole (PROTONIX) 40 MG tablet Take 1 tablet (40 mg total) by mouth daily. 90 tablet 2  . rifaximin (XIFAXAN) 550 MG  TABS tablet Take 1 tablet (550 mg total) by mouth 2 (two) times daily. 60 tablet 1  . spironolactone (ALDACTONE) 100 MG tablet Take 100 mg by mouth daily.    . temazepam (RESTORIL) 15 MG capsule TAKE 1 CAPSULE(15 MG) BY MOUTH AT BEDTIME 30 capsule 3  . Zinc Sulfate (ZINC 15 PO) Take by mouth.     No current facility-administered medications for this visit.    Allergies as of 01/06/2020 - Review Complete 01/04/2020  Allergen Reaction Noted  . Dilaudid [hydromorphone hcl] Nausea And Vomiting 03/19/2019    ROS:  General: Negative for anorexia, weight loss, fever, chills, fatigue, weakness. ENT: Negative for hoarseness, difficulty swallowing , nasal congestion. CV: Negative for chest pain, angina, palpitations, dyspnea on exertion, peripheral edema.  Respiratory: Negative for dyspnea at rest, dyspnea on exertion, cough, sputum, wheezing.  GI: See history of present illness. GU:  Negative for dysuria, hematuria, urinary incontinence, urinary frequency, nocturnal urination.  Endo: Negative for unusual weight change.    Physical Examination:   There were no vitals taken for this visit.  General: Well-nourished, well-developed in no acute distress.  Mild liver flap present Eyes: No icterus. Conjunctivae pink. Abdomen: Bowel sounds are normal, nontender, nondistended, no hepatosplenomegaly or masses, no abdominal bruits or hernia , no rebound or guarding.   Extremities: No lower extremity edema. No clubbing or deformities. Psych: Alert and cooperative, normal mood and affect.   Imaging Studies: DG Chest Port 1 View  Result Date: 01/04/2020 CLINICAL DATA:  Weakness. EXAM: PORTABLE CHEST 1 VIEW COMPARISON:  None. FINDINGS: Mild to moderate enlargement of the cardiac silhouette. Clear lungs with normal vascularity. Mild peribronchial thickening. Mild lower thoracic spine degenerative changes. IMPRESSION: 1. Mild bronchitic changes. 2. Mild to moderate cardiomegaly. Electronically Signed    By: Claudie Revering M.D.   On: 01/04/2020 16:31    Assessment and Plan:   Adam Duncan is a 69 y.o. 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  History of TIPS which has been working so far very well.  Recently transferred care to me when he moved to [facility].  Presented to the emergency room with an episode of hepatic encephalopathy.  Last similar episode was a couple of years back.  Likely precipitant based on history is constipation.  He also takes temazepam at night which she has been doing so for many years.  He does have history of sleep inversion which is seen and hepatic encephalopathy.  Plan 1.  Obtain ultrasound of the abdomen not only to screen for [facility] but also to evaluate for ascites.  If ascites is seen then we need to determine if the shunt is not functioning the way it should as shunt should prevent the development of ascites. 2.  I am going to increase his lactulose  from 40 ml  twice daily to 40 mL 3 times daily.  I have also informed him that if he is not having an adequate bowel movement to take an additional dose so that he has at least 2 soft bowel movements per day.  The aim is not to develop diarrhea as dehydration can precipitate an episode of hepatic encephalopathy. 3.  Continue Xifaxan. 4.  I did briefly talk about getting evaluated for sleep apnea but the patient said that he does not have sleep apnea 5.  I did suggest that holding temazepam might reduces the risk of hepatic encephalopathy as benzodiazepines are the precipitating cause for the same.  In addition it is a possibility that his sleep inversion is more from hepatic encephalopathy than from insomnia due to other reasons 6.  Will need vaccination for hepatitis a and B as he is not immune to the same  Dr Adam Bellows  MD,MRCP [facility]) Follow up in 7 to 10 days telephone visit

## 2020-01-12 ENCOUNTER — Other Ambulatory Visit: Payer: Self-pay

## 2020-01-12 MED ORDER — SPIRONOLACTONE 100 MG PO TABS: 100.0000 mg | ORAL_TABLET | Freq: Every day | ORAL | 1 refills | Status: DC

## 2020-01-14 ENCOUNTER — Other Ambulatory Visit: Payer: Self-pay

## 2020-01-14 ENCOUNTER — Ambulatory Visit
Admission: RE | Admit: 2020-01-14 | Discharge: 2020-01-14 | Disposition: A | Discharge status: Home / Self Care | Payer: Medicare Other | Source: Ambulatory Visit | Attending: Gastroenterology | Admitting: Gastroenterology

## 2020-01-14 DIAGNOSIS — K7581 Nonalcoholic steatohepatitis (NASH): Secondary | ICD-10-CM | POA: Insufficient documentation

## 2020-01-14 DIAGNOSIS — K7460 Unspecified cirrhosis of liver: Secondary | ICD-10-CM | POA: Diagnosis present

## 2020-01-15 ENCOUNTER — Encounter: Payer: Self-pay | Admitting: Gastroenterology

## 2020-01-15 ENCOUNTER — Other Ambulatory Visit: Admission: RE | Admit: 2020-01-15 | Payer: Medicare Other | Source: Ambulatory Visit

## 2020-01-19 ENCOUNTER — Ambulatory Visit: Payer: Medicare Other | Admitting: Anesthesiology

## 2020-01-19 ENCOUNTER — Encounter
Admission: RE | Disposition: A | Discharge status: Home / Self Care | Payer: Self-pay | Source: Ambulatory Visit | Attending: Gastroenterology

## 2020-01-19 ENCOUNTER — Ambulatory Visit
Admission: RE | Admit: 2020-01-19 | Discharge: 2020-01-19 | Disposition: A | Discharge status: Home / Self Care | Payer: Medicare Other | Source: Ambulatory Visit | Attending: Gastroenterology | Admitting: Gastroenterology

## 2020-01-19 ENCOUNTER — Other Ambulatory Visit: Payer: Self-pay

## 2020-01-19 ENCOUNTER — Encounter: Admission: RE | Payer: Self-pay | Source: Home / Self Care

## 2020-01-19 ENCOUNTER — Ambulatory Visit: Admission: RE | Admit: 2020-01-19 | Payer: Medicare Other | Source: Home / Self Care | Admitting: Gastroenterology

## 2020-01-19 DIAGNOSIS — Z8546 Personal history of malignant neoplasm of prostate: Secondary | ICD-10-CM | POA: Diagnosis not present

## 2020-01-19 DIAGNOSIS — K317 Polyp of stomach and duodenum: Secondary | ICD-10-CM | POA: Diagnosis not present

## 2020-01-19 DIAGNOSIS — Z85828 Personal history of other malignant neoplasm of skin: Secondary | ICD-10-CM | POA: Insufficient documentation

## 2020-01-19 DIAGNOSIS — D123 Benign neoplasm of transverse colon: Secondary | ICD-10-CM | POA: Insufficient documentation

## 2020-01-19 DIAGNOSIS — K219 Gastro-esophageal reflux disease without esophagitis: Secondary | ICD-10-CM | POA: Insufficient documentation

## 2020-01-19 DIAGNOSIS — K7581 Nonalcoholic steatohepatitis (NASH): Secondary | ICD-10-CM

## 2020-01-19 DIAGNOSIS — K7460 Unspecified cirrhosis of liver: Secondary | ICD-10-CM | POA: Diagnosis not present

## 2020-01-19 DIAGNOSIS — G4700 Insomnia, unspecified: Secondary | ICD-10-CM | POA: Insufficient documentation

## 2020-01-19 DIAGNOSIS — K640 First degree hemorrhoids: Secondary | ICD-10-CM | POA: Insufficient documentation

## 2020-01-19 DIAGNOSIS — Z7984 Long term (current) use of oral hypoglycemic drugs: Secondary | ICD-10-CM | POA: Diagnosis not present

## 2020-01-19 DIAGNOSIS — M109 Gout, unspecified: Secondary | ICD-10-CM | POA: Insufficient documentation

## 2020-01-19 DIAGNOSIS — K635 Polyp of colon: Secondary | ICD-10-CM

## 2020-01-19 DIAGNOSIS — I10 Essential (primary) hypertension: Secondary | ICD-10-CM | POA: Insufficient documentation

## 2020-01-19 DIAGNOSIS — Z86010 Personal history of colonic polyps: Secondary | ICD-10-CM | POA: Diagnosis not present

## 2020-01-19 DIAGNOSIS — Z79899 Other long term (current) drug therapy: Secondary | ICD-10-CM | POA: Insufficient documentation

## 2020-01-19 DIAGNOSIS — E119 Type 2 diabetes mellitus without complications: Secondary | ICD-10-CM | POA: Diagnosis not present

## 2020-01-19 DIAGNOSIS — L290 Pruritus ani: Secondary | ICD-10-CM

## 2020-01-19 DIAGNOSIS — K644 Residual hemorrhoidal skin tags: Secondary | ICD-10-CM | POA: Diagnosis not present

## 2020-01-19 DIAGNOSIS — D122 Benign neoplasm of ascending colon: Secondary | ICD-10-CM | POA: Diagnosis not present

## 2020-01-19 DIAGNOSIS — Z1211 Encounter for screening for malignant neoplasm of colon: Secondary | ICD-10-CM | POA: Diagnosis not present

## 2020-01-19 HISTORY — PX: COLONOSCOPY WITH PROPOFOL: SHX5780

## 2020-01-19 HISTORY — PX: ESOPHAGOGASTRODUODENOSCOPY (EGD) WITH PROPOFOL: SHX5813

## 2020-01-19 LAB — GLUCOSE, CAPILLARY: Glucose-Capillary: 150 mg/dL — ABNORMAL HIGH (ref 70–99)

## 2020-01-19 SURGERY — COLONOSCOPY WITH PROPOFOL
Anesthesia: General

## 2020-01-19 MED ORDER — PROPOFOL 10 MG/ML IV BOLUS
INTRAVENOUS | Status: DC | PRN
  Administered 2020-01-19: 100 mg via INTRAVENOUS

## 2020-01-19 MED ORDER — SODIUM CHLORIDE 0.9 % IV SOLN
INTRAVENOUS | Status: DC
  Administered 2020-01-19: 1000 mL via INTRAVENOUS

## 2020-01-19 MED ORDER — LIDOCAINE HCL (PF) 2 % IJ SOLN
INTRAMUSCULAR | Status: AC
  Filled 2020-01-19: qty 5

## 2020-01-19 MED ORDER — LIDOCAINE HCL (CARDIAC) PF 100 MG/5ML IV SOSY
PREFILLED_SYRINGE | INTRAVENOUS | Status: DC | PRN
  Administered 2020-01-19: 100 mg via INTRAVENOUS

## 2020-01-19 MED ORDER — PROPOFOL 500 MG/50ML IV EMUL
INTRAVENOUS | Status: DC | PRN
  Administered 2020-01-19: 130 ug/kg/min via INTRAVENOUS

## 2020-01-19 MED ORDER — PROPOFOL 500 MG/50ML IV EMUL
INTRAVENOUS | Status: AC
  Filled 2020-01-19: qty 50

## 2020-01-19 NOTE — Op Note (Signed)
[facility] Gastroenterology Patient Name: Adam Duncan Procedure Date: 01/19/2020 9:44 AM MRN: [id number] Account #: [id number] Date of Birth: 07-03-1951 Admit Type: Outpatient Age: 69 Room: [facility] ENDO ROOM 4 Gender: Male Note Status: Finalized Procedure:             Upper GI endoscopy Indications:           Cirrhosis rule out esophageal varices Providers:             Jonathon Bellows MD, MD Referring MD:          Jobe Marker. Einar Pheasant (Referring MD) Medicines:             Monitored Anesthesia Care Complications:         No immediate complications. Procedure:             Pre-Anesthesia Assessment:                        - Prior to the procedure, a History and Physical was                         performed, and patient medications, allergies and                         sensitivities were reviewed. The patient's tolerance                         of previous anesthesia was reviewed.                        - The risks and benefits of the procedure and the                         sedation options and risks were discussed with the                         patient. All questions were answered and informed                         consent was obtained.                        - ASA Grade Assessment: III - A patient with severe                         systemic disease.                        After obtaining informed consent, the endoscope was                         passed under direct vision. Throughout the procedure,                         the patient's blood pressure, pulse, and oxygen                         saturations were monitored continuously. The Endoscope                         was introduced through the  mouth, and advanced to the                         third part of duodenum. The upper GI endoscopy was                         accomplished with ease. The patient tolerated the                         procedure well. Findings:      The esophagus was normal.      The  examined duodenum was normal.      Two 20 to 25 mm semi-sessile polyps with no bleeding and no stigmata of       recent bleeding were found at the incisura.      The cardia and gastric fundus were normal on retroflexion. Impression:            - Normal esophagus.                        - Normal examined duodenum.                        - Two gastric polyps.                        - No specimens collected. Recommendation:        - Refer to [facility] for EUS to r/o the gastric polyps from                         gastric varices and for resection if not a varix due                         to large size                        - Perform a colonoscopy today. Procedure Code(s):     --- Professional ---                        [phone], Esophagogastroduodenoscopy, flexible,                         transoral; diagnostic, including collection of                         specimen(s) by brushing or washing, when performed                         (separate procedure) Diagnosis Code(s):     --- Professional ---                        K31.7, Polyp of stomach and duodenum                        K74.60, Unspecified cirrhosis of liver CPT copyright 2019 American Medical Association. All rights reserved. The codes documented in this report are preliminary and upon coder review may  be revised to meet current compliance requirements. Jonathon Bellows, MD Jonathon Bellows MD, MD 01/19/2020 9:54:52 AM This report has been signed electronically.  Number of Addenda: 0 Note Initiated On: 01/19/2020 9:44 AM Estimated Blood Loss:  Estimated blood loss: none.      [facility]

## 2020-01-19 NOTE — Anesthesia Preprocedure Evaluation (Signed)
Anesthesia Evaluation  Patient identified by MRN, date of birth, ID band Patient awake    Reviewed: Allergy & Precautions, NPO status , Patient's Chart, lab work & pertinent test results  History of Anesthesia Complications Negative for: history of anesthetic complications  Airway Mallampati: III  TM Distance: >3 FB Neck ROM: Full    Dental no notable dental hx.    Pulmonary neg pulmonary ROS, neg sleep apnea, neg COPD,    breath sounds clear to auscultation- rhonchi (-) wheezing      Cardiovascular hypertension, Pt. on medications (-) CAD, (-) Past MI, (-) Cardiac Stents and (-) CABG  Rhythm:Regular Rate:Normal - Systolic murmurs and - Diastolic murmurs    Neuro/Psych neg Seizures negative neurological ROS  negative psych ROS   GI/Hepatic GERD  ,(+) Cirrhosis   Esophageal Varices    ,   Endo/Other  diabetes, Oral Hypoglycemic Agents  Renal/GU      Musculoskeletal negative musculoskeletal ROS (+)   Abdominal (+) + obese,   Peds  Hematology negative hematology ROS (+)   Anesthesia Other Findings Past Medical History: 01/04/2020: Acute confusion No date: Allergy No date: Cancer of prostate ([place]) No date: 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 No date: Diabetes mellitus without complication (HCC) No date: GERD (gastroesophageal reflux disease) No date: Gout No date: Hypertension No date: Insomnia No date: Skin cancer   Reproductive/Obstetrics                             Anesthesia Physical Anesthesia Plan  ASA: III  Anesthesia Plan: General   Post-op Pain Management:    Induction: Intravenous  PONV Risk Score and Plan: 1 and Propofol infusion  Airway Management Planned: Natural Airway  Additional Equipment:   Intra-op Plan:   Post-operative Plan:   Informed Consent: I have reviewed the patients History and Physical, chart, labs and discussed the  procedure including the risks, benefits and alternatives for the proposed anesthesia with the patient or authorized representative who has indicated his/her understanding and acceptance.     Dental advisory given  Plan Discussed with: CRNA and Anesthesiologist  Anesthesia Plan Comments:         Anesthesia Quick Evaluation

## 2020-01-19 NOTE — H&P (Signed)
Jonathon Bellows, MD [address], [place], [place], [place], [address] [address], [place], [place], [place], [address] Phone: [phone]  Fax: [phone]  Primary Care Physician:  Lesleigh Noe, MD   Pre-Procedure History & Physical: HPI:  Adam Duncan is a 69 y.o. male is here for an endoscopy and colonoscopy    Past Medical History:  Diagnosis Date  . Acute confusion 01/04/2020  . Allergy   . Cancer of prostate ([place])   . 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 Diabetes mellitus without complication ([place])   . GERD (gastroesophageal reflux disease)   . Gout   . Hypertension   . Insomnia   . Skin cancer     Past Surgical History:  Procedure Laterality Date  . CATARACT EXTRACTION    . HERNIA REPAIR  2015   x 2 and mesh was put in   . NASAL SEPTUM SURGERY    . PROSTATE SURGERY    . TIPS PROCEDURE    . TONSILLECTOMY AND ADENOIDECTOMY      Prior to Admission medications   Medication Sig Start Date End Date Taking? Authorizing Provider  acetaminophen (TYLENOL) 500 MG tablet Take 1 tablet (500 mg total) by mouth every 8 (eight) hours as needed. 04/22/19  Yes Ria Bush, MD  allopurinol (ZYLOPRIM) 100 MG tablet TAKE 1 TABLET(100 MG) BY MOUTH DAILY 12/31/19  Yes Lesleigh Noe, MD  Ascorbic Acid (VITAMIN C) 1000 MG tablet Take 1,000 mg by mouth daily.   Yes [provider]  B Complex Vitamins (VITAMIN B COMPLEX PO) Take by mouth.   Yes [provider]  Continuous Blood Gluc Sensor (FREESTYLE LIBRE 14 DAY SENSOR) MISC 1 each by Does not apply route every 14 (fourteen) days. DX E11.9 03/19/19  Yes Lesleigh Noe, MD  Dulaglutide (TRULICITY) 1.5 LK/4.4WN SOPN Inject 1.5 mg into the skin once a week. 10/29/19  Yes Lesleigh Noe, MD  Ergocalciferol (VITAMIN D2 PO) Take 1 tablet by mouth daily.   Yes [provider]  glipiZIDE (GLUCOTROL XL) 10 MG 24 hr tablet Take 1 tablet (10 mg total) by mouth daily with breakfast.  03/27/19  Yes Lesleigh Noe, MD  hepatitis A-hepatitis B [facility]) 720-20 ELU-MCG/ML injection First dose: Inject 1 ml into muscle, Second dose: Inject 4 weeks after initial dose, Third dose: Inject 6 months after initial dose. 01/06/20  Yes Jonathon Bellows, MD  hydrOXYzine (ATARAX/VISTARIL) 50 MG tablet TAKE 1 TABLET BY MOUTH EVERY 8 HOURS AS NEEDED FOR ITCHING 12/01/19  Yes Lesleigh Noe, MD  lactulose (CHRONULAC) 10 GM/15ML solution TAKE 40 MILLILITERS BY MOUTH TWICE A DAY 12/26/19  Yes Lesleigh Noe, MD  levocetirizine (XYZAL) 5 MG tablet Xyzal 5 mg tablet  Take 1 tablet every day by oral route.   Yes [provider]  metFORMIN (GLUCOPHAGE) 1000 MG tablet Take 1 tablet (1,000 mg total) by mouth 2 (two) times daily with a meal. 03/27/19  Yes Lesleigh Noe, MD  methocarbamol (ROBAXIN) 500 MG tablet Take 1 tablet (500 mg total) by mouth 3 (three) times daily as needed for muscle spasms (sedation precautions). 10/29/19  Yes Lesleigh Noe, MD  montelukast (SINGULAIR) 10 MG tablet Take 10 mg by mouth at bedtime.   Yes [provider]  pantoprazole (PROTONIX) 40 MG tablet Take 1 tablet (40 mg total) by mouth daily. 08/22/19  Yes Lesleigh Noe, MD  rifaximin (  XIFAXAN) 550 MG TABS tablet Take 1 tablet (550 mg total) by mouth 2 (two) times daily. 04/22/19  Yes Ria Bush, MD  spironolactone (ALDACTONE) 100 MG tablet Take 1 tablet (100 mg total) by mouth daily. 01/12/20  Yes Jonathon Bellows, MD  temazepam (RESTORIL) 15 MG capsule TAKE 1 CAPSULE(15 MG) BY MOUTH AT BEDTIME 09/22/19  Yes Lesleigh Noe, MD  Zinc Sulfate (ZINC 15 PO) Take by mouth.   Yes [provider]  lactulose ([place]) 10 GM/15ML solution TAKE 30 MLS BY MOUTH TWICE DAILY 10/01/19   Lesleigh Noe, MD    Allergies as of 01/19/2020 - Review Complete 01/19/2020  Allergen Reaction Noted  . Dilaudid [hydromorphone hcl] Nausea And Vomiting 03/19/2019    Family History  Problem Relation Age of Onset  . Lymphoma  Mother   . Lung cancer Father   . Rheum arthritis Sister   . Irritable bowel syndrome Sister   . COPD Brother   . Cancer Brother        unsure of type  . Cancer Maternal Grandfather        unsure of type  . Cancer Paternal Grandfather        unsure of type  . Arthritis Sister     Social History   Socioeconomic History  . Marital status: Married    Spouse name: Mariann Laster Myriam Jacobson)  . Number of children: 2  . Years of education: Masters Degree - Theology   . Highest education level: Not on file  Occupational History  . Not on file  Tobacco Use  . Smoking status: Never Smoker  . Smokeless tobacco: Never Used  Substance and Sexual Activity  . Alcohol use: Not Currently  . Drug use: Never  . Sexual activity: Not Currently  Other Topics Concern  . 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 Difficulty of Paying Living Expenses: Not hard at all  Food Insecurity:   . Worried About Charity fundraiser in the Last Year:   . Arboriculturist in the Last Year:   Transportation Needs:   . Film/video editor (Medical):   Marland Kitchen Lack of Transportation (Non-Medical):   Physical Activity:   . Days of Exercise per Week:   . Minutes of Exercise per Session:   Stress:   . Feeling of Stress :   Social Connections:   . Frequency of Communication with Friends and Family:   . Frequency of Social Gatherings with Friends and Family:   . Attends Religious Services:   . Active Member of  Clubs or Organizations:   . Attends Archivist Meetings:   Marland Kitchen Marital Status:   Intimate Partner Violence:   . Fear of Current or Ex-Partner:   . Emotionally Abused:   .  Physically Abused:   . Sexually Abused:     Review of Systems: See HPI, otherwise negative ROS  Physical Exam: BP 138/60   Pulse 68   Temp (!) 97.4 F (36.3 C) (Temporal)   Resp 20   Ht 5' 7.5" (1.715 m)   Wt 129.7 kg   SpO2 99%   BMI 44.13 kg/m  General:   Alert,  pleasant and cooperative in NAD Head:  Normocephalic and atraumatic. Neck:  Supple; no masses or thyromegaly. Lungs:  Clear throughout to auscultation, normal respiratory effort.    Heart:  +S1, +S2, Regular rate and rhythm, No edema. Abdomen:  Soft, nontender and nondistended. Normal bowel sounds, without guarding, and without rebound.   Neurologic:  Alert and  oriented x4;  grossly normal neurologically.  Impression/Plan: Adam Duncan is here for an endoscopy and colonoscopy  to be performed for  evaluation of esophageal varices and colon cancer screening - personal history of colon polyps    Risks, benefits, limitations, and alternatives regarding endoscopy have been reviewed with the patient.  Questions have been answered.  All parties agreeable.   Jonathon Bellows, MD  01/19/2020, 9:38 AM

## 2020-01-19 NOTE — Op Note (Addendum)
[facility] Patient Name: Adam Duncan Procedure Date: 01/19/2020 9:43 AM MRN: [id number] Account #: [id number] Date of Birth: 10-23-1950 Admit Type: Outpatient Age: 69 Room: [facility] ENDO ROOM 4 Gender: Male Note Status: Finalized Procedure:             Colonoscopy Indications:           High risk colon cancer surveillance: Personal history                         of colonic polyps Providers:             Jonathon Bellows MD, MD Referring MD:          Jobe Marker. Einar Pheasant (Referring MD) Medicines:             Monitored Anesthesia Care Complications:         No immediate complications. Procedure:             Pre-Anesthesia Assessment:                        - Prior to the procedure, a History and Physical was                         performed, and patient medications, allergies and                         sensitivities were reviewed. The patient's tolerance                         of previous anesthesia was reviewed.                        - The risks and benefits of the procedure and the                         sedation options and risks were discussed with the                         patient. All questions were answered and informed                         consent was obtained.                        - ASA Grade Assessment: III - A patient with severe                         systemic disease.                        After obtaining informed consent, the colonoscope was                         passed under direct vision. Throughout the procedure,                         the patient's blood pressure, pulse, and oxygen                         saturations were monitored continuously. The  Colonoscope was introduced through the anus and                         advanced to the the cecum, identified by the                         appendiceal orifice. The colonoscopy was performed                         with ease. The patient tolerated the  procedure well.                         The quality of the bowel preparation was excellent. Findings:      Skin tags were found on perianal exam.      Non-bleeding internal hemorrhoids were found during retroflexion. The       hemorrhoids were large and Grade I (internal hemorrhoids that do not       prolapse).      A 8 mm polyp was found in the transverse colon. The polyp was sessile.       The polyp was removed with a cold snare. Resection and retrieval were       complete. To prevent bleeding after the polypectomy, one hemostatic clip       was successfully placed. There was no bleeding at the end of the       procedure.      Two sessile polyps were found in the ascending colon. The polyps were 4       to 6 mm in size. These polyps were removed with a cold snare. Resection       and retrieval were complete.      The exam was otherwise without abnormality on direct and retroflexion       views. Impression:            - Perianal skin tags found on perianal exam.                        - Non-bleeding internal hemorrhoids.                        - One 8 mm polyp in the transverse colon, removed with                         a cold snare. Resected and retrieved. Clip was placed.                        - Two 4 to 6 mm polyps in the ascending colon, removed                         with a cold snare. Resected and retrieved.                        - The examination was otherwise normal on direct and                         retroflexion views. Recommendation:        - Discharge patient to home (with escort).                        -  Resume previous diet.                        - Continue present medications.                        - Await pathology results.                        - Repeat colonoscopy for surveillance based on                         pathology results.                        - Return to GI office in 4 weeks. Procedure Code(s):     --- Professional ---                         [phone], Colonoscopy, flexible; with removal of                         tumor(s), polyp(s), or other lesion(s) by snare                         technique Diagnosis Code(s):     --- Professional ---                        K63.5, Polyp of colon                        Z86.010, Personal history of colonic polyps                        K64.0, First degree hemorrhoids                        K64.4, Residual hemorrhoidal skin tags CPT copyright 2019 [employer]. All rights reserved. The codes documented in this report are preliminary and upon coder review may  be revised to meet current compliance requirements. Jonathon Bellows, MD Jonathon Bellows MD, MD 01/19/2020 10:25:23 AM This report has been signed electronically. Number of Addenda: 0 Note Initiated On: 01/19/2020 9:43 AM Scope Withdrawal Time: 0 hours 21 minutes 1 second  Total Procedure Duration: 0 hours 26 minutes 24 seconds  Estimated Blood Loss:  Estimated blood loss: none.      [facility]

## 2020-01-19 NOTE — Anesthesia Postprocedure Evaluation (Signed)
Anesthesia Post Note  Patient: Adam Duncan  Procedure(s) Performed: COLONOSCOPY WITH PROPOFOL (N/A ) ESOPHAGOGASTRODUODENOSCOPY (EGD) WITH PROPOFOL (N/A )  Patient location during evaluation: Endoscopy Anesthesia Type: General Level of consciousness: awake and alert and oriented Pain management: pain level controlled Vital Signs Assessment: post-procedure vital signs reviewed and stable Respiratory status: spontaneous breathing, nonlabored ventilation and respiratory function stable Cardiovascular status: blood pressure returned to baseline and stable Postop Assessment: no signs of nausea or vomiting Anesthetic complications: no     Last Vitals:  Vitals:   01/19/20 1027 01/19/20 1030  BP: 123/73 123/73  Pulse: 61   Resp:  18  Temp: (!) 36.1 C   SpO2:  100%    Last Pain:  Vitals:   01/19/20 1027  TempSrc: Temporal                 Avory Mimbs

## 2020-01-19 NOTE — Transfer of Care (Signed)
Immediate Anesthesia Transfer of Care Note  Patient: Adam Duncan  Procedure(s) Performed: COLONOSCOPY WITH PROPOFOL (N/A ) ESOPHAGOGASTRODUODENOSCOPY (EGD) WITH PROPOFOL (N/A )  Patient Location: Endoscopy Unit  Anesthesia Type:General  Level of Consciousness: drowsy and patient cooperative  Airway & Oxygen Therapy: Patient Spontanous Breathing  Post-op Assessment: Report given to RN and Post -op Vital signs reviewed and stable  Post vital signs: Reviewed and stable  Last Vitals:  Vitals Value Taken Time  BP 123/73 01/19/20 1030  Temp 36.1 C 01/19/20 1027  Pulse 62 01/19/20 1030  Resp 16 01/19/20 1030  SpO2 100 % 01/19/20 1030  Vitals shown include unvalidated device data.  Last Pain:  Vitals:   01/19/20 1027  TempSrc: Temporal         Complications: No apparent anesthesia complications

## 2020-01-20 ENCOUNTER — Encounter: Payer: Self-pay | Admitting: Gastroenterology

## 2020-01-20 ENCOUNTER — Ambulatory Visit: Payer: Medicare Other | Admitting: Gastroenterology

## 2020-01-20 ENCOUNTER — Other Ambulatory Visit: Payer: Self-pay

## 2020-01-20 ENCOUNTER — Encounter: Payer: Self-pay | Admitting: *Deleted

## 2020-01-20 DIAGNOSIS — K317 Polyp of stomach and duodenum: Secondary | ICD-10-CM

## 2020-01-20 LAB — SURGICAL PATHOLOGY

## 2020-01-21 ENCOUNTER — Telehealth: Payer: Self-pay | Admitting: Internal Medicine

## 2020-01-21 NOTE — Telephone Encounter (Signed)
Pt reported that Xifaxan has been reduced from >$1000 to $645.  Pt inquired whether we have samples.

## 2020-01-21 NOTE — Telephone Encounter (Signed)
Previous message sent in error.  Please disregard.

## 2020-01-27 ENCOUNTER — Other Ambulatory Visit: Payer: Self-pay | Admitting: Family Medicine

## 2020-01-27 DIAGNOSIS — G4700 Insomnia, unspecified: Secondary | ICD-10-CM

## 2020-01-27 NOTE — Telephone Encounter (Signed)
Last refilled on 09/22/19 #30 with 3 refills. LOV 10/29/19 and next appointment on 01/28/20

## 2020-01-28 ENCOUNTER — Ambulatory Visit (INDEPENDENT_AMBULATORY_CARE_PROVIDER_SITE_OTHER): Payer: Medicare Other | Admitting: Family Medicine

## 2020-01-28 ENCOUNTER — Other Ambulatory Visit: Payer: Self-pay

## 2020-01-28 VITALS — BP 132/78 | HR 65 | Temp 97.7°F | Resp 22 | Ht 67.75 in | Wt 292.2 lb

## 2020-01-28 DIAGNOSIS — J3089 Other allergic rhinitis: Secondary | ICD-10-CM

## 2020-01-28 DIAGNOSIS — M1A9XX0 Chronic gout, unspecified, without tophus (tophi): Secondary | ICD-10-CM

## 2020-01-28 DIAGNOSIS — E1165 Type 2 diabetes mellitus with hyperglycemia: Secondary | ICD-10-CM | POA: Diagnosis not present

## 2020-01-28 DIAGNOSIS — E119 Type 2 diabetes mellitus without complications: Secondary | ICD-10-CM

## 2020-01-28 DIAGNOSIS — G4700 Insomnia, unspecified: Secondary | ICD-10-CM

## 2020-01-28 DIAGNOSIS — L299 Pruritus, unspecified: Secondary | ICD-10-CM

## 2020-01-28 DIAGNOSIS — K7469 Other cirrhosis of liver: Secondary | ICD-10-CM

## 2020-01-28 LAB — LIPID PANEL
Cholesterol: 147 mg/dL (ref 0–200)
HDL: 34.9 mg/dL — ABNORMAL LOW (ref >39.00)
LDL Cholesterol: 81 mg/dL (ref 0–99)
NonHDL: 112.23
Total CHOL/HDL Ratio: 4
Triglycerides: 157 mg/dL — ABNORMAL HIGH (ref 0.0–149.0)
VLDL: 31.4 mg/dL (ref 0.0–40.0)

## 2020-01-28 LAB — POCT GLYCOSYLATED HEMOGLOBIN (HGB A1C): Hemoglobin A1C: 7.7 % — AB (ref 4.0–5.6)

## 2020-01-28 LAB — URIC ACID: Uric Acid, Serum: 4 mg/dL (ref 4.0–7.8)

## 2020-01-28 MED ORDER — MONTELUKAST SODIUM 10 MG PO TABS: 10.0000 mg | ORAL_TABLET | Freq: Every day | ORAL | 3 refills | Status: DC

## 2020-01-28 MED ORDER — TEMAZEPAM 7.5 MG PO CAPS: 7.5000 mg | ORAL_CAPSULE | Freq: Every evening | ORAL | 0 refills | Status: DC | PRN

## 2020-01-28 NOTE — Assessment & Plan Note (Signed)
Pt does not want encephalopathy again and is interested in stopping temazepam but was unable to stop. Will try lower dose and use melatonin as adjunct. If still difficulty stopping may send to sleep specialist to help with wean.

## 2020-01-28 NOTE — Patient Instructions (Signed)
#  Diabetes - keep working on diet - continue medications - if Weekly average glucose higher than 160 call the office as I may want to send you to Endocrinology - otherwise if weekly average less than 160 return in 3 months  #Insomnia - try a lower dose of the sleep medication - can try melatonin 3-5 mg too - if unable to decrease, we may want to send you to a sleep specialist  #Itching - dermatology evaluation - I'll place a referral for you

## 2020-01-28 NOTE — Assessment & Plan Note (Signed)
Following with GI, appreciate their support. He got his Hep a/b per their recs. Doing well with increased lactulose dose.

## 2020-01-28 NOTE — Assessment & Plan Note (Signed)
Allopurinol for several years. Repeat uric acid. No flares in decades. He asked about stopping, discussed he could try but would increase risk for flare. He will continue for now.

## 2020-01-28 NOTE — Assessment & Plan Note (Signed)
Hgb A1c improved. And home monitoring over the last week with even better control. Continue current medications (on max dose). He will call if his average CBG >160 - will plan for endo referral. Otherwise f/u in 3 months.

## 2020-01-28 NOTE — Progress Notes (Signed)
Subjective:     Adam Duncan is a 69 y.o. male presenting for Diabetes (follow up)     HPI  #Diabetes Currently taking Trulicity 1.5 mg, glipizide 10 mg, Metformin 1000 mg BID Using medications without difficulties: Yes Hypoglycemic episodes:No  Hyperglycemic episodes:Yes  Feet problems:itching only Blood Sugars averaging: 145 average this week Last HgbA1c:  Lab Results  Component Value Date   HGBA1C 7.7 (A) 01/28/2020   Diet: between holidays thinks he is doing better overall   Diabetes Health Maintenance Due:    Diabetes Health Maintenance Due  Topic Date Due  . URINE MICROALBUMIN  07/27/2020  . HEMOGLOBIN A1C  07/30/2020  . FOOT EXAM  10/28/2020  . OPHTHALMOLOGY EXAM  12/07/2020   #Stomach polyps - initially thought he would need surgery but told they don't have to come out - no hx of anemia  #cirrhosis - got Hep a/b - taking the lactulose and rifaximin - chronic itching - using lotion and hydroxyzine (taking three times a day) - having at least 3 bm daily   #Insomnia - tried weaning himself off the restoril and was up until 3-4 am -   #Gout - last flare 1995 - allopurinol has been working well   Review of Systems  01/06/2020: GI - Dr Vicente Males - encephalopathy follow-up, increase lactulose to help with constipation. Xifaxan continued (but expensive), ?sleep apnea, consider stopping temazepam for sleep. Needs hep a/b vaccines   Social History   Tobacco Use  Smoking Status Never Smoker  Smokeless Tobacco Never Used        Objective:    BP Readings from Last 3 Encounters:  01/28/20 132/78  01/19/20 139/77  01/06/20 (!) 146/78   Wt Readings from Last 3 Encounters:  01/28/20 292 lb 4 oz (132.6 kg)  01/19/20 286 lb (129.7 kg)  01/06/20 289 lb 6.4 oz (131.3 kg)    BP 132/78   Pulse 65   Temp 97.7 F (36.5 C)   Resp (!) 22   Ht 5' 7.75" (1.721 m)   Wt 292 lb 4 oz (132.6 kg)   SpO2 99%   BMI 44.77 kg/m    Physical  Exam Constitutional:      Appearance: Normal appearance. He is not ill-appearing or diaphoretic.  HENT:     Right Ear: External ear normal.     Left Ear: External ear normal.     Nose: Nose normal.  Eyes:     General: No scleral icterus.    Extraocular Movements: Extraocular movements intact.     Conjunctiva/sclera: Conjunctivae normal.  Cardiovascular:     Rate and Rhythm: Normal rate and regular rhythm.     Heart sounds: No murmur.  Pulmonary:     Effort: Pulmonary effort is normal. No respiratory distress.     Breath sounds: Normal breath sounds. No wheezing.  Musculoskeletal:     Cervical back: Neck supple.  Skin:    General: Skin is warm and dry.  Neurological:     Mental Status: He is alert. Mental status is at baseline.  Psychiatric:        Mood and Affect: Mood normal.           Assessment & Plan:   Problem List Items Addressed This Visit      Digestive   Cirrhosis of liver ([place])    Following with GI, appreciate their support. He got his Hep a/b per their recs. Doing well with increased lactulose dose.  Endocrine   Poorly controlled type 2 diabetes mellitus (HCC)    Hgb A1c improved. And home monitoring over the last week with even better control. Continue current medications (on max dose). He will call if his average CBG >160 - will plan for endo referral. Otherwise f/u in 3 months.         Other   Gout    Allopurinol for several years. Repeat uric acid. No flares in decades. He asked about stopping, discussed he could try but would increase risk for flare. He will continue for now.       Relevant Orders   Uric acid   Itching    OK control with lotion and hydroxyzine though also told that liver disease may not be the cause and concern for crawling sensation. Will send to dermatology to see if there are alternative options -- especially given sedation risk of hydroxyzine.       Relevant Orders   Ambulatory referral to Dermatology   Insomnia     Pt does not want encephalopathy again and is interested in stopping temazepam but was unable to stop. Will try lower dose and use melatonin as adjunct. If still difficulty stopping may send to sleep specialist to help with wean.       Relevant Medications   temazepam (RESTORIL) 7.5 MG capsule    Other Visit Diagnoses    Controlled type 2 diabetes mellitus without complication, without long-term current use of insulin (HCC)    -  Primary   Relevant Orders   POCT glycosylated hemoglobin (Hb A1C) (Completed)   Lipid panel   Environmental and seasonal allergies       Relevant Medications   montelukast (SINGULAIR) 10 MG tablet       Return in about 3 months (around 04/29/2020).  Lesleigh Noe, MD

## 2020-01-28 NOTE — Assessment & Plan Note (Signed)
OK control with lotion and hydroxyzine though also told that liver disease may not be the cause and concern for crawling sensation. Will send to dermatology to see if there are alternative options -- especially given sedation risk of hydroxyzine.

## 2020-02-25 ENCOUNTER — Telehealth: Payer: Self-pay | Admitting: Family Medicine

## 2020-02-25 NOTE — Telephone Encounter (Signed)
Left message for patient to call back and schedule Medicare Annual Wellness Visit (AWV) with Nurse Health Advisor   This should be a telephone visit only.  No hx of AWV

## 2020-03-11 ENCOUNTER — Other Ambulatory Visit: Payer: Self-pay

## 2020-03-11 DIAGNOSIS — E119 Type 2 diabetes mellitus without complications: Secondary | ICD-10-CM

## 2020-03-11 DIAGNOSIS — K7469 Other cirrhosis of liver: Secondary | ICD-10-CM

## 2020-03-11 DIAGNOSIS — M25511 Pain in right shoulder: Secondary | ICD-10-CM

## 2020-03-11 DIAGNOSIS — M1A9XX0 Chronic gout, unspecified, without tophus (tophi): Secondary | ICD-10-CM

## 2020-03-11 DIAGNOSIS — L299 Pruritus, unspecified: Secondary | ICD-10-CM

## 2020-03-11 NOTE — Telephone Encounter (Signed)
Left message for Adam Duncan to call office back with name of medications and pharmacy in [place] he would like his refills sent to.

## 2020-03-11 NOTE — Telephone Encounter (Signed)
Pt left v/m that he is presently in [place] for a month and needs refills on his meds. Pt had visit for DM on 01/28/20 and pt has future medicare wellness with Dr Einar Pheasant on 0/16/21. I tried to call pt but was unable to reach pt to get which meds pt needs refills on and pharmacy info in [place]. Sending note to [facility] CMA.

## 2020-03-12 ENCOUNTER — Other Ambulatory Visit: Payer: Self-pay | Admitting: Family Medicine

## 2020-03-12 DIAGNOSIS — E119 Type 2 diabetes mellitus without complications: Secondary | ICD-10-CM

## 2020-03-12 DIAGNOSIS — M1A9XX0 Chronic gout, unspecified, without tophus (tophi): Secondary | ICD-10-CM

## 2020-03-12 MED ORDER — HYDROXYZINE HCL 50 MG PO TABS: ORAL_TABLET | ORAL | 0 refills | Status: DC

## 2020-03-12 MED ORDER — FREESTYLE LIBRE 14 DAY SENSOR MISC: 1.0000 | 0 refills | Status: DC

## 2020-03-12 MED ORDER — ALLOPURINOL 100 MG PO TABS: ORAL_TABLET | ORAL | 0 refills | Status: DC

## 2020-03-12 MED ORDER — METFORMIN HCL 1000 MG PO TABS: 1000.0000 mg | ORAL_TABLET | Freq: Two times a day (BID) | ORAL | 0 refills | Status: DC

## 2020-03-12 MED ORDER — SPIRONOLACTONE 100 MG PO TABS: 100.0000 mg | ORAL_TABLET | Freq: Every day | ORAL | 0 refills | Status: DC

## 2020-03-12 MED ORDER — RIFAXIMIN 550 MG PO TABS: 550.0000 mg | ORAL_TABLET | Freq: Two times a day (BID) | ORAL | 0 refills | Status: DC

## 2020-03-12 MED ORDER — METHOCARBAMOL 500 MG PO TABS: 500.0000 mg | ORAL_TABLET | Freq: Three times a day (TID) | ORAL | 0 refills | Status: DC | PRN

## 2020-03-12 NOTE — Telephone Encounter (Signed)
Patient called back   He is needing the following medications sent    Continuous Blood Gluc Sensor (FREESTYLE LIBRE 14 DAY SENSOR allopurinol (ZYLOPRIM) 100 MG tablet hydrOXYzine (ATARAX/VISTARIL) 50 MG tablet metFORMIN (GLUCOPHAGE) 1000 MG tablet spironolactone (ALDACTONE) 100 MG tablet rifaximin (XIFAXAN) 550 MG TABS tablet methocarbamol (ROBAXIN) 500 MG tablet        [facility]- [address], [place] [address]

## 2020-03-12 NOTE — Telephone Encounter (Signed)
Refills sent as requested. Muscle relaxant forwarded to Dr. Einar Pheasant for approval.

## 2020-03-12 NOTE — Telephone Encounter (Signed)
Last office visit 01/28/20 for diabetes. Last refill 10/29/19 #30 with no refills. CPE scheduled for 05/03/20.

## 2020-04-03 ENCOUNTER — Other Ambulatory Visit: Payer: Self-pay | Admitting: Family Medicine

## 2020-04-03 DIAGNOSIS — E119 Type 2 diabetes mellitus without complications: Secondary | ICD-10-CM

## 2020-04-24 ENCOUNTER — Other Ambulatory Visit: Payer: Self-pay | Admitting: Family Medicine

## 2020-04-24 DIAGNOSIS — E119 Type 2 diabetes mellitus without complications: Secondary | ICD-10-CM

## 2020-04-27 ENCOUNTER — Ambulatory Visit: Payer: Medicare Other

## 2020-04-27 ENCOUNTER — Other Ambulatory Visit: Payer: Self-pay

## 2020-04-27 ENCOUNTER — Telehealth: Payer: Self-pay

## 2020-04-27 NOTE — Telephone Encounter (Signed)
Called patient 3 times trying to complete his Medicare Wellness visit. Patient never answered. Left message on voicemail notifying patient appointment will be cancelled and completed at his upcoming physical with provider.

## 2020-04-28 ENCOUNTER — Ambulatory Visit: Payer: Medicare Other | Admitting: Family Medicine

## 2020-05-03 ENCOUNTER — Encounter: Payer: Medicare Other | Admitting: Family Medicine

## 2020-05-05 ENCOUNTER — Ambulatory Visit (INDEPENDENT_AMBULATORY_CARE_PROVIDER_SITE_OTHER): Payer: Medicare Other | Admitting: Family Medicine

## 2020-05-05 ENCOUNTER — Encounter: Payer: Self-pay | Admitting: Family Medicine

## 2020-05-05 ENCOUNTER — Other Ambulatory Visit: Payer: Self-pay

## 2020-05-05 VITALS — BP 138/58 | HR 80 | Temp 98.0°F | Ht 67.75 in | Wt 288.5 lb

## 2020-05-05 DIAGNOSIS — B353 Tinea pedis: Secondary | ICD-10-CM | POA: Insufficient documentation

## 2020-05-05 DIAGNOSIS — K219 Gastro-esophageal reflux disease without esophagitis: Secondary | ICD-10-CM | POA: Diagnosis not present

## 2020-05-05 DIAGNOSIS — M25511 Pain in right shoulder: Secondary | ICD-10-CM

## 2020-05-05 DIAGNOSIS — Z0000 Encounter for general adult medical examination without abnormal findings: Secondary | ICD-10-CM

## 2020-05-05 DIAGNOSIS — Z23 Encounter for immunization: Secondary | ICD-10-CM

## 2020-05-05 MED ORDER — METHOCARBAMOL 500 MG PO TABS: 500.0000 mg | ORAL_TABLET | Freq: Three times a day (TID) | ORAL | 0 refills | Status: DC | PRN

## 2020-05-05 MED ORDER — TERBINAFINE HCL 1 % EX CREA: 1.0000 "application " | TOPICAL_CREAM | Freq: Two times a day (BID) | CUTANEOUS | 0 refills | Status: DC

## 2020-05-05 MED ORDER — PANTOPRAZOLE SODIUM 20 MG PO TBEC: 20.0000 mg | DELAYED_RELEASE_TABLET | Freq: Every day | ORAL | 3 refills | Status: DC

## 2020-05-05 NOTE — Patient Instructions (Addendum)
You have a Hemorrhoid  Here are ways to prevent future issues and to treat your hemorrhoid 1) Make sure you get 25-35 g of INSOLUBLE fiber a day -- ideally from diet but you can also take a fiber supplement like Metamucil 2) Drink 1.5 to 2 liters of water daily  3) Avoid straining and prolonged periods on the toilet 4) Limit fatty foods and alcohol 5) Try to lose weight  How to care for your hemorrhoid now 1) Sitz Baths and warm water sprays 2) Wipe with a wet wipe or flushable wipe  3) Consider using stool softeners (Like Colace or Miralax) 4) Topical Hemorrhoid cream (Preparation H is available over the counter)  5) Steroid suppository - though can be expensive  Let me know if no improvement in 6 weeks -- you may need surgery   Use lamisil for foot - chat with dermatology if not improving   Adam Duncan , Thank you for taking time to come for your Medicare Wellness Visit. I appreciate your ongoing commitment to your health goals. Please review the following plan we discussed and let me know if I can assist you in the future.   These are the goals we discussed: Goals    .  Diabetes Patient stated goal (pt-stated)      CBG 120s is his goal    .  Weight (lb) < 250 lb (113.4 kg)       This is a list of the screening recommended for you and due dates:  Health Maintenance  Topic Date Due  . Tetanus Vaccine  Never done  . Pneumonia vaccines (1 of 2 - PCV13) 11/03/2015  . Flu Shot  04/18/2020  . Urine Protein Check  07/27/2020  . Hemoglobin A1C  07/30/2020  . Complete foot exam   10/28/2020  . Eye exam for diabetics  12/07/2020  . Colon Cancer Screening  01/19/2023  . COVID-19 Vaccine  Completed  .  Hepatitis C: One time screening is recommended by Center for Disease Control  (CDC) for  adults born from [birth year] through 1965.   Completed

## 2020-05-05 NOTE — Progress Notes (Signed)
Subjective:   Adam Duncan is a 69 y.o. male who presents for an Initial Medicare Annual Wellness Visit.  Review of Systems  Constitutional: Negative for chills and fever.  HENT: Negative for congestion and sore throat.   Eyes: Negative for blurred vision and double vision.  Respiratory: Negative for shortness of breath.   Cardiovascular: Negative for chest pain.  Gastrointestinal: Negative for heartburn, nausea and vomiting.       Bleeding hemorrhoids  Genitourinary: Negative.   Musculoskeletal: Negative.  Negative for myalgias.  Skin: Positive for rash (foot).  Neurological: Negative for dizziness and headaches.  Endo/Heme/Allergies: Does not bruise/bleed easily.  Psychiatric/Behavioral: Negative for depression. The patient is not nervous/anxious.    Pain with long distance w/o pain - able to walk up to 20 yards w/o pain  Skin rash - present for several weeks - itchy   Cardiac Risk Factors include: advanced age (>85mn, >>35women);diabetes mellitus;dyslipidemia     Objective:    Today's Vitals   05/05/20 1021  BP: (!) 138/58  Pulse: 80  Temp: 98 F (36.7 C)  TempSrc: Temporal  SpO2: 97%  Weight: 288 lb 8 oz (130.9 kg)  Height: 5' 7.75" (1.721 m)   Body mass index is 44.19 kg/m.  Advanced Directives 05/05/2020 01/19/2020 01/04/2020  Does Patient Have a Medical Advance Directive? Yes Yes No  Type of Advance Directive Living will Living will -  Does patient want to make changes to medical advance directive? No - Patient declined - -  Would patient like information on creating a medical advance directive? - - No - Patient declined    Current Medications (verified) Outpatient Encounter Medications as of 05/05/2020  Medication Sig  . acetaminophen (TYLENOL) 500 MG tablet Take 1 tablet (500 mg total) by mouth every 8 (eight) hours as needed.  .Marland Kitchenallopurinol (ZYLOPRIM) 100 MG tablet TAKE 1 TABLET(100 MG) BY MOUTH DAILY  . Ascorbic Acid (VITAMIN C) 1000 MG  tablet Take 1,000 mg by mouth daily.  . B Complex Vitamins (VITAMIN B COMPLEX PO) Take by mouth.  . Continuous Blood Gluc Sensor (FREESTYLE LIBRE 14 DAY SENSOR) MISC APPLY EVERY 14 DAYS AS DIRECTED  . Dulaglutide (TRULICITY) 1.5 MSF/6.8LESOPN Inject 1.5 mg into the skin once a week.  . Ergocalciferol (VITAMIN D2 PO) Take 1 tablet by mouth daily.  .Marland KitchenglipiZIDE (GLUCOTROL XL) 10 MG 24 hr tablet TAKE 1 TABLET(10 MG) BY MOUTH DAILY WITH BREAKFAST  . hepatitis A-hepatitis B (TWINRIX) 720-20 ELU-MCG/ML injection First dose: Inject 1 ml into muscle, Second dose: Inject 4 weeks after initial dose, Third dose: Inject 6 months after initial dose.  . hydrOXYzine (ATARAX/VISTARIL) 50 MG tablet TAKE 1 TABLET BY MOUTH EVERY 8 HOURS AS NEEDED FOR ITCHING  . lactulose (CHRONULAC) 10 GM/15ML solution TAKE 40 MILLILITERS BY MOUTH TWICE A DAY  . levocetirizine (XYZAL) 5 MG tablet Xyzal 5 mg tablet  Take 1 tablet every day by oral route.  . metFORMIN (GLUCOPHAGE) 1000 MG tablet TAKE 1 TABLET(1000 MG) BY MOUTH TWICE DAILY WITH A MEAL  . methocarbamol (ROBAXIN) 500 MG tablet Take 1 tablet (500 mg total) by mouth 3 (three) times daily as needed for muscle spasms (sedation precautions).  . montelukast (SINGULAIR) 10 MG tablet Take 1 tablet (10 mg total) by mouth at bedtime.  . pantoprazole (PROTONIX) 20 MG tablet Take 1 tablet (20 mg total) by mouth daily.  . rifaximin (XIFAXAN) 550 MG TABS tablet Take 1 tablet (550 mg total) by mouth  2 (two) times daily.  Marland Kitchen spironolactone (ALDACTONE) 100 MG tablet TAKE 1 TABLET(100 MG) BY MOUTH DAILY  . temazepam (RESTORIL) 7.5 MG capsule Take 1 capsule (7.5 mg total) by mouth at bedtime as needed for sleep.  Marland Kitchen Zinc Sulfate (ZINC 15 PO) Take by mouth.  . [DISCONTINUED] lactulose (CHRONULAC) 10 GM/15ML solution Take 10 g by mouth 3 (three) times daily.  . [DISCONTINUED] methocarbamol (ROBAXIN) 500 MG tablet Take 1 tablet (500 mg total) by mouth 3 (three) times daily as needed for muscle  spasms (sedation precautions).  . [DISCONTINUED] pantoprazole (PROTONIX) 40 MG tablet Take 1 tablet (40 mg total) by mouth daily.  Marland Kitchen terbinafine (LAMISIL) 1 % cream Apply 1 application topically 2 (two) times daily.  . [DISCONTINUED] lactulose (CHRONULAC) 10 GM/15ML solution TAKE 30 MLS BY MOUTH TWICE DAILY   No facility-administered encounter medications on file as of 05/05/2020.    Allergies (verified) Dilaudid [hydromorphone hcl]   History: Past Medical History:  Diagnosis Date  . Acute confusion 01/04/2020  . Allergy   . Cancer of prostate ([place])   . 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 Diabetes mellitus without complication ([place])   . GERD (gastroesophageal reflux disease)   . Gout   . Hypertension   . Insomnia   . Skin cancer    Past Surgical History:  Procedure Laterality Date  . CATARACT EXTRACTION    . COLONOSCOPY WITH PROPOFOL N/A 01/19/2020   Procedure: COLONOSCOPY WITH PROPOFOL;  Surgeon: Jonathon Bellows, MD;  Location[facility] ENDOSCOPY;  Service: Gastroenterology;  Laterality: N/A;  . ESOPHAGOGASTRODUODENOSCOPY (EGD) WITH PROPOFOL N/A 01/19/2020   Procedure: ESOPHAGOGASTRODUODENOSCOPY (EGD) WITH PROPOFOL;  Surgeon: Jonathon Bellows, MD;  Location: [facility] ENDOSCOPY;  Service: Gastroenterology;  Laterality: N/A;  . HERNIA REPAIR  2015   x 2 and mesh was put in   . NASAL SEPTUM SURGERY    . PROSTATE SURGERY    . TIPS PROCEDURE    . TONSILLECTOMY AND ADENOIDECTOMY     Family History  Problem Relation Age of Onset  . Lymphoma Mother   . Lung cancer Father   . Rheum arthritis Sister   . Irritable bowel syndrome Sister   . COPD Brother   . Cancer Brother        unsure of type  . Cancer Maternal Grandfather        unsure of type  . Cancer Paternal Grandfather        unsure of type  . Arthritis Sister    Social History   Socioeconomic History  . Marital status: Married    Spouse name: Mariann Laster Myriam Jacobson)  . Number of children: 2  . Years of education: Masters Degree -  Theology   . Highest education level: Not on file  Occupational History  . Not on file  Tobacco Use  . Smoking status: Never Smoker  . Smokeless tobacco: Never Used  Vaping Use  . Vaping Use: Never used  Substance and Sexual Activity  . Alcohol use: Not Currently  . Drug use: Never  . Sexual activity: Not Currently  Other Topics Concern  . 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 Difficulty of Paying Living Expenses:   Food Insecurity:   . Worried About Charity fundraiser in the Last Year:   . Arboriculturist in the Last Year:   Transportation Needs:   . Film/video editor (Medical):   Marland Kitchen Lack of Transportation (Non-Medical):   Physical Activity:   . Days of Exercise per Week:   . Minutes of Exercise per Session:   Stress:   . Feeling of Stress :   Social Connections:   . Frequency of Communication with Friends and Family:   . Frequency of Social Gatherings with Friends and Family:   . Attends Religious Services:   . Active Member of Clubs or Organizations:   . Attends Archivist Meetings:   Marland Kitchen Marital Status:     Tobacco Counseling Counseling given: Not Answered   Clinical Intake:  Pre-visit preparation completed: No  Pain : No/denies pain     Nutritional Status: BMI > 30  Obese Nutritional Risks: None Diabetes: Yes CBG done?: No Did pt. bring in CBG monitor from home?: Yes Glucose Meter Downloaded?: No (150s average)  How often do you need  to have someone help you when you read instructions, pamphlets, or other written materials from your doctor or pharmacy?: 1 - Never What is the last grade level you completed in school?: master's degree    Interpreter Needed?: No      Activities of Daily Living In your present state of health, do you have any difficulty performing the following activities: 05/05/2020  Hearing? N  Vision? N  Difficulty concentrating or making decisions? N  Walking or climbing stairs? Y  Comment climbing stairs due to pain  Dressing or bathing? N  Doing errands, shopping? N  Preparing Food and eating ? N  Using the Toilet? N  In the past six months, have you accidently leaked urine? Y  Comment s/p prostate cancer  Do you have problems with loss of bowel control? N  Managing your Medications? N  Managing your Finances? N  Housekeeping or managing your Housekeeping? N  Some recent data might be hidden    Patient Care Team: Lesleigh Noe, MD as PCP - General (Family Medicine) Jonathon Bellows, MD as Consulting Physician (Gastroenterology)  Indicate any recent Medical Services you may have received from other than [facility] providers in the past year (date may be approximate).     Assessment:   This is a routine wellness examination for Kristoffer.  Hearing/Vision screen  Hearing Screening   125Hz  250Hz  500Hz  1000Hz  2000Hz  3000Hz  4000Hz  6000Hz  8000Hz   Right ear:  20 20 20 20  20     Left ear:  20 20 20 20   0    Vision Screening Comments: Last exam in 2021 at [facility] in [place]  Dietary issues and exercise activities discussed: Current Exercise Habits: Home exercise routine, Type of exercise: walking;stretching, Time (Minutes): 10, Frequency (Times/Week): 7, Weekly Exercise (Minutes/Week): 70, Intensity: Mild, Exercise limited by: orthopedic condition(s)  Goals    .  Diabetes Patient stated goal (pt-stated)      CBG  120s is his goal    .  Weight (lb) < 250 lb (113.4 kg)      Depression  Screen PHQ 2/9 Scores 05/05/2020 05/05/2020 03/19/2019  PHQ - 2 Score 0 0 3  PHQ- 9 Score 8 - 9    Fall Risk Fall Risk  05/05/2020  Falls in the past year? 0  Number falls in past yr: 0  Injury with Fall? 0  Risk for fall due to : History of fall(s)  Risk for fall due to: Comment mechanical  Follow up Falls prevention discussed    Any stairs in or around the home? No  If so, are there any without handrails? n/a Home free of loose throw rugs in walkways, pet beds, electrical cords, etc? Yes  Adequate lighting in your home to reduce risk of falls? Yes   ASSISTIVE DEVICES UTILIZED TO PREVENT FALLS:  Life alert? No  Use of a cane, walker or w/c? Yes  Grab bars in the bathroom? Yes  Shower chair or bench in shower? yes Elevated toilet seat or a handicapped toilet? Yes   TIMED UP AND GO:  Was the test performed? No .  Length of time to ambulate 10 feet: 2 sec.   Gait steady and fast without use of assistive device  Cognitive Function:         Mini-Cog - 05/05/20 1048    Normal clock drawing test? yes    How many words correct? 2            Immunizations Immunization History  Administered Date(s) Administered  . Hep A / Hep B 01/13/2020  . Influenza, Seasonal, Injecte, Preservative Fre 07/24/2019  . PFIZER SARS-COV-2 Vaccination 10/27/2019, 11/19/2019  . Pneumococcal Conjugate-13 05/05/2020  . Pneumococcal-Unspecified 06/25/2007    TDAP status: Due, Education has been provided regarding the importance of this vaccine. Advised may receive this vaccine at local pharmacy or Health Dept. Aware to provide a copy of the vaccination record if obtained from local pharmacy or Health Dept. Verbalized acceptance and understanding. Flu Vaccine status: Up to date Pneumococcal vaccine status: Completed during today's visit. Covid-19 vaccine status: Completed vaccines  Qualifies for Shingles Vaccine? Yes   Shingrix Completed?: No.    Education has been provided regarding the  importance of this vaccine. Patient has been advised to call insurance company to determine out of pocket expense if they have not yet received this vaccine. Advised may also receive vaccine at local pharmacy or Health Dept. Verbalized acceptance and understanding.  Screening Tests Health Maintenance  Topic Date Due  . TETANUS/TDAP  Never done  . INFLUENZA VACCINE  04/18/2020  . URINE MICROALBUMIN  07/27/2020  . HEMOGLOBIN A1C  07/30/2020  . FOOT EXAM  10/28/2020  . OPHTHALMOLOGY EXAM  12/07/2020  . PNA vac Low Risk Adult (2 of 2 - PPSV23) 05/05/2021  . COLONOSCOPY  01/19/2023  . COVID-19 Vaccine  Completed  . Hepatitis C Screening  Completed    Health Maintenance  Health Maintenance Due  Topic Date Due  . TETANUS/TDAP  Never done  . INFLUENZA VACCINE  04/18/2020    Colorectal cancer screening: Completed 01/2020. Repeat every 3 years  Additional Screening:  Hepatitis C Screening: does qualify; Completed already  Vision Screening: Recommended annual ophthalmology exams for early detection of glaucoma and other disorders of the eye. Is the patient up to date with their annual eye exam?  Yes  Who is the provider or what is the name of the office in which  the patient attends annual eye exams? [facility] If pt is not established with a provider, would they like to be referred to a provider to establish care? n/a.   Dental Screening: Recommended annual dental exams for proper oral hygiene  Community Resource Referral / Chronic Care Management: CRR required this visit?  No   CCM required this visit?  No      Plan:     Problem List Items Addressed This Visit      Digestive   Gastroesophageal reflux disease    Decrease pantoprazole to 20 mg daily      Relevant Medications   pantoprazole (PROTONIX) 20 MG tablet     Musculoskeletal and Integument   Tinea pedis of left foot    Trial of lamisil. Has derm appt next week if not improved      Relevant Medications    terbinafine (LAMISIL) 1 % cream     Other   Acute pain of right shoulder   Relevant Medications   methocarbamol (ROBAXIN) 500 MG tablet    Other Visit Diagnoses    Encounter for Medicare annual wellness exam    -  Primary   Need for vaccination with 13-polyvalent pneumococcal conjugate vaccine       Relevant Orders   Pneumococcal conjugate vaccine 13-valent IM (Completed)       I have personally reviewed and noted the following in the patient's chart:   . Medical and social history . Use of alcohol, tobacco or illicit drugs  . Current medications and supplements . Functional ability and status . Nutritional status . Physical activity . Advanced directives . List of other physicians . Hospitalizations, surgeries, and ER visits in previous 12 months . Vitals . Screenings to include cognitive, depression, and falls . Referrals and appointments  In addition, I have reviewed and discussed with patient certain preventive protocols, quality metrics, and best practice recommendations. A written personalized care plan for preventive services as well as general preventive health recommendations were provided to patient.     Lesleigh Noe, MD   05/05/2020

## 2020-05-05 NOTE — Assessment & Plan Note (Signed)
Decrease pantoprazole to 20 mg daily

## 2020-05-05 NOTE — Assessment & Plan Note (Signed)
Trial of lamisil. Has derm appt next week if not improved

## 2020-05-10 ENCOUNTER — Other Ambulatory Visit: Payer: Self-pay

## 2020-05-10 ENCOUNTER — Ambulatory Visit (INDEPENDENT_AMBULATORY_CARE_PROVIDER_SITE_OTHER): Payer: Medicare Other | Admitting: Dermatology

## 2020-05-10 DIAGNOSIS — Z1283 Encounter for screening for malignant neoplasm of skin: Secondary | ICD-10-CM | POA: Diagnosis not present

## 2020-05-10 DIAGNOSIS — D229 Melanocytic nevi, unspecified: Secondary | ICD-10-CM

## 2020-05-10 DIAGNOSIS — C4491 Basal cell carcinoma of skin, unspecified: Secondary | ICD-10-CM

## 2020-05-10 DIAGNOSIS — D1800 Hemangioma unspecified site: Secondary | ICD-10-CM

## 2020-05-10 DIAGNOSIS — B353 Tinea pedis: Secondary | ICD-10-CM | POA: Diagnosis not present

## 2020-05-10 DIAGNOSIS — L570 Actinic keratosis: Secondary | ICD-10-CM | POA: Diagnosis not present

## 2020-05-10 DIAGNOSIS — D485 Neoplasm of uncertain behavior of skin: Secondary | ICD-10-CM

## 2020-05-10 DIAGNOSIS — L820 Inflamed seborrheic keratosis: Secondary | ICD-10-CM

## 2020-05-10 DIAGNOSIS — C44519 Basal cell carcinoma of skin of other part of trunk: Secondary | ICD-10-CM

## 2020-05-10 DIAGNOSIS — L814 Other melanin hyperpigmentation: Secondary | ICD-10-CM

## 2020-05-10 DIAGNOSIS — L821 Other seborrheic keratosis: Secondary | ICD-10-CM

## 2020-05-10 DIAGNOSIS — L578 Other skin changes due to chronic exposure to nonionizing radiation: Secondary | ICD-10-CM

## 2020-05-10 DIAGNOSIS — C44612 Basal cell carcinoma of skin of right upper limb, including shoulder: Secondary | ICD-10-CM

## 2020-05-10 HISTORY — DX: Basal cell carcinoma of skin, unspecified: C44.91

## 2020-05-10 MED ORDER — TERBINAFINE HCL 250 MG PO TABS: 250.0000 mg | ORAL_TABLET | ORAL | 2 refills | Status: DC

## 2020-05-10 MED ORDER — KETOCONAZOLE 2 % EX CREA: 1.0000 "application " | TOPICAL_CREAM | Freq: Every day | CUTANEOUS | 2 refills | Status: DC

## 2020-05-10 NOTE — Progress Notes (Signed)
New Patient Visit Consult from Dr. Waunita Schooner  Subjective  Adam Duncan is a 69 y.o. male who presents for the following: Annual Exam (History of skin cancer of chest x ~30 years ago - TBSE and skin cancer screening today.) and Rash (of left foot x 3-4 months that is itchy.). The patient presents for Total-Body Skin Exam (TBSE) for skin cancer screening and mole check.  The following portions of the chart were reviewed this encounter and updated as appropriate:  Tobacco  Allergies  Meds  Problems  Med Hx  Surg Hx  Fam Hx     Review of Systems:  No other skin or systemic complaints except as noted in HPI or Assessment and Plan.  Objective  Well appearing patient in no apparent distress; mood and affect are within normal limits.  A full examination was performed including scalp, head, eyes, ears, nose, lips, neck, chest, axillae, abdomen, back, buttocks, bilateral upper extremities, bilateral lower extremities, hands, feet, fingers, toes, fingernails, and toenails. All findings within normal limits unless otherwise noted below.  Objective  Right post deltoid: 1.1 cm pink brown flat papule  Objective  Right Lower Back lat: 1.2 cm pink patch  Objective  Left Forehead x 1, left preauricular x 1 (2): Erythematous keratotic or waxy stuck-on papule or plaque.   Objective  Left Dorsum of Foot: Scaling and maceration web spaces and over distal and lateral soles.   Objective  Left Supraclavicular Area (2), Mid Tip of Nose: Erythematous thin papules/macules with gritty scale.    Assessment & Plan  Neoplasm of uncertain behavior of skin (2) Right post deltoid  Epidermal / dermal shaving  Lesion diameter (cm):  1.1 Informed consent: discussed and consent obtained   Timeout: patient name, date of birth, surgical site, and procedure verified   Procedure prep:  Patient was prepped and draped in usual sterile fashion Prep type:  Isopropyl alcohol Anesthesia: the  lesion was anesthetized in a standard fashion   Anesthetic:  1% lidocaine w/ epinephrine 1-100,000 buffered w/ 8.4% NaHCO3 Instrument used: flexible razor blade   Hemostasis achieved with: pressure, aluminum chloride and electrodesiccation   Outcome: patient tolerated procedure well   Post-procedure details: sterile dressing applied and wound care instructions given   Dressing type: bandage and petrolatum    Specimen 1 - Surgical pathology Differential Diagnosis: Inflamed SK vs BCC vs other  Check Margins: No 1.1 cm pink brown flat papule  Right Lower Back lat  Skin / nail biopsy Type of biopsy: tangential   Informed consent: discussed and consent obtained   Timeout: patient name, date of birth, surgical site, and procedure verified   Procedure prep:  Patient was prepped and draped in usual sterile fashion Prep type:  Isopropyl alcohol Anesthesia: the lesion was anesthetized in a standard fashion   Anesthetic:  1% lidocaine w/ epinephrine 1-100,000 buffered w/ 8.4% NaHCO3 Instrument used: flexible razor blade   Hemostasis achieved with: pressure, aluminum chloride and electrodesiccation   Outcome: patient tolerated procedure well   Post-procedure details: sterile dressing applied and wound care instructions given   Dressing type: bandage and petrolatum    Specimen 2 - Surgical pathology Differential Diagnosis: BCC vs other  Check Margins: No 1.2 cm pink patch  Inflamed seborrheic keratosis (2) Left Forehead x 1, left preauricular x 1  Destruction of lesion - Left Forehead x 1, left preauricular x 1 Complexity: simple   Destruction method: cryotherapy   Informed consent: discussed and consent obtained  Timeout:  patient name, date of birth, surgical site, and procedure verified Lesion destroyed using liquid nitrogen: Yes   Region frozen until ice ball extended beyond lesion: Yes   Outcome: patient tolerated procedure well with no complications   Post-procedure details:  wound care instructions given    Tinea pedis of left foot -severe requiring systemic treatment. Left Dorsum of Foot terbinafine (LAMISIL) 250 MG tablet - Left Dorsum of Foot  ketoconazole (NIZORAL) 2 % cream - Left Dorsum of Foot  AK (actinic keratosis) (2) Mid Tip of Nose; Left Supraclavicular Area  Destruction of lesion - Mid Tip of Nose Complexity: simple   Destruction method: cryotherapy   Informed consent: discussed and consent obtained   Timeout:  patient name, date of birth, surgical site, and procedure verified Lesion destroyed using liquid nitrogen: Yes   Region frozen until ice ball extended beyond lesion: Yes   Outcome: patient tolerated procedure well with no complications   Post-procedure details: wound care instructions given     Lentigines - Scattered tan macules - Discussed due to sun exposure - Benign, observe - Call for any changes  Seborrheic Keratoses - Stuck-on, waxy, tan-brown papules and plaques  - Discussed benign etiology and prognosis. - Observe - Call for any changes  Melanocytic Nevi - Tan-brown and/or pink-flesh-colored symmetric macules and papules - Benign appearing on exam today - Observation - Call clinic for new or changing moles - Recommend daily use of broad spectrum spf 30+ sunscreen to sun-exposed areas.   Hemangiomas - Red papules - Discussed benign nature - Observe - Call for any changes  Actinic Damage - diffuse scaly erythematous macules with underlying dyspigmentation - Recommend daily broad spectrum sunscreen SPF 30+ to sun-exposed areas, reapply every 2 hours as needed.  - Call for new or changing lesions.  Skin cancer screening performed today.  Return in about 3 months (around 08/10/2020).  I, Ashok Cordia, CMA, am acting as scribe for Sarina Ser, MD .  Documentation: I have reviewed the above documentation for accuracy and completeness, and I agree with the above.  Sarina Ser, MD

## 2020-05-10 NOTE — Patient Instructions (Signed)

## 2020-05-11 ENCOUNTER — Other Ambulatory Visit: Payer: Self-pay | Admitting: Family Medicine

## 2020-05-11 DIAGNOSIS — K219 Gastro-esophageal reflux disease without esophagitis: Secondary | ICD-10-CM

## 2020-05-12 ENCOUNTER — Telehealth: Payer: Self-pay

## 2020-05-12 NOTE — Telephone Encounter (Signed)
-----   Message from Ralene Bathe, MD sent at 05/11/2020  5:33 PM EDT ----- 1. 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 Skin , right lower back lat SUPERFICIAL BASAL CELL CARCINOMA  1&2 - both Cancer = BCC Superficial and early Schedule for treatment (EDC)

## 2020-05-12 NOTE — Telephone Encounter (Signed)
Patient informed of pathology results. He prefers to wait until his already scheduled appointment in November to treat BCC's.

## 2020-05-18 ENCOUNTER — Other Ambulatory Visit: Payer: Self-pay | Admitting: Family Medicine

## 2020-05-18 ENCOUNTER — Encounter: Payer: Self-pay | Admitting: Dermatology

## 2020-05-18 DIAGNOSIS — K7469 Other cirrhosis of liver: Secondary | ICD-10-CM

## 2020-05-18 DIAGNOSIS — L299 Pruritus, unspecified: Secondary | ICD-10-CM

## 2020-06-10 ENCOUNTER — Other Ambulatory Visit: Payer: Self-pay | Admitting: Family Medicine

## 2020-06-10 DIAGNOSIS — M1A9XX0 Chronic gout, unspecified, without tophus (tophi): Secondary | ICD-10-CM

## 2020-06-22 ENCOUNTER — Other Ambulatory Visit: Payer: Self-pay | Admitting: Family Medicine

## 2020-06-22 DIAGNOSIS — L299 Pruritus, unspecified: Secondary | ICD-10-CM

## 2020-06-22 DIAGNOSIS — K7469 Other cirrhosis of liver: Secondary | ICD-10-CM

## 2020-06-25 NOTE — Telephone Encounter (Signed)
Last office visit 05/05/2020 for [facility].  Last refilled 05/18/2020 for #90 with no refills.  Next Appt: 08/02/2020 for 3 month follow up.

## 2020-06-28 ENCOUNTER — Other Ambulatory Visit: Payer: Self-pay | Admitting: Family Medicine

## 2020-06-28 DIAGNOSIS — M1A9XX0 Chronic gout, unspecified, without tophus (tophi): Secondary | ICD-10-CM

## 2020-07-06 ENCOUNTER — Other Ambulatory Visit: Payer: Self-pay | Admitting: Dermatology

## 2020-07-06 DIAGNOSIS — B353 Tinea pedis: Secondary | ICD-10-CM

## 2020-07-21 ENCOUNTER — Other Ambulatory Visit: Payer: Self-pay

## 2020-07-21 ENCOUNTER — Encounter: Payer: Self-pay | Admitting: Gastroenterology

## 2020-07-21 ENCOUNTER — Ambulatory Visit (INDEPENDENT_AMBULATORY_CARE_PROVIDER_SITE_OTHER): Payer: Medicare Other | Admitting: Gastroenterology

## 2020-07-21 VITALS — BP 130/75 | HR 84 | Temp 98.1°F | Ht 67.0 in | Wt 295.8 lb

## 2020-07-21 DIAGNOSIS — K317 Polyp of stomach and duodenum: Secondary | ICD-10-CM | POA: Diagnosis not present

## 2020-07-21 DIAGNOSIS — K7581 Nonalcoholic steatohepatitis (NASH): Secondary | ICD-10-CM

## 2020-07-21 DIAGNOSIS — R1011 Right upper quadrant pain: Secondary | ICD-10-CM | POA: Diagnosis not present

## 2020-07-21 DIAGNOSIS — K7460 Unspecified cirrhosis of liver: Secondary | ICD-10-CM | POA: Diagnosis not present

## 2020-07-21 DIAGNOSIS — E538 Deficiency of other specified B group vitamins: Secondary | ICD-10-CM | POA: Diagnosis not present

## 2020-07-21 MED ORDER — RIFAXIMIN 550 MG PO TABS: 550.0000 mg | ORAL_TABLET | Freq: Two times a day (BID) | ORAL | 5 refills | Status: AC

## 2020-07-21 NOTE — Addendum Note (Signed)
Addended by: Dorethea Clan on: 07/21/2020 10:17 AM   Modules accepted: Orders

## 2020-07-21 NOTE — Addendum Note (Signed)
Addended by: Dorethea Clan on: 07/21/2020 10:20 AM   Modules accepted: Orders

## 2020-07-21 NOTE — Progress Notes (Signed)
Jonathon Bellows MD, MRCP(U.K) [address]  [place]  [place], [place] [address]  Main: [phone]  Fax: [phone]   Primary Care Physician: Lesleigh Noe, MD  Primary Gastroenterologist:  Dr. Jonathon Bellows   Chief Complaint  Patient presents with  . Follow-up    Abd pain    HPI: Adam Duncan is a 69 y.o. male    Summary of history :  He was last seen by office in April 2021.  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  He moved from the [place] region. Diagnosed with cirrhosis of liver and ascites in the year 2000 in 2015 he underwent a TIPS procedure for significant ascites and since then has had no reaccumulation.   No history of variceal bleeding or hepatic encephalopathy. His weight has been stable. Denied any excess alcohol use. Denies having any viral hepatitis in the past. History of anal discomfort in terms of itching due to excessive cleaning. He has a history of colon polyps in the past .   04/28/2019: Ultrasound abdomen demonstrates hepatic cirrhosis with moderate to severe splenomegaly. 04/28/2019 ultrasound pelvis negative ultrasound 05/06/2019 CT scan of the abdomen pelvis with contrast demonstrates the shunt in the right hepatic lobe. Cirrhosis and splenomegaly.  He presented to the emergency room on 01/04/2020 with confusion. 01/04/2020: Glucose greater than 500, serum glucose of 232, AST and ALT normal, creatinine normal.  Hemoglobin 11.5 stable. 12/31/2019 hepatitis A total antibody negative, hepatitis B surface antibody negative INR 1.1   He takes his lactulose daily along with Xifaxan 550 mg twice daily.    Interval history  01/06/2020-07/21/2020  01/14/2020: Right upper quadrant ultrasound.  Cirrhosis with associated splenomegaly no significant ascites  01/19/2020: EGD:2 x25 mm semisolid polyps with no bleeding and no stigmata of recent bleeding noted.  Refer to [facility] for EUS to differentiate from  gastric varices and possible resection.  01/19/2020 colonoscopy skin tags noted 8 mm polyp in the transverse colon.  2 sessile polyps was found in the ascending colon 4 to 6 mm in size.  Resected. There were tubular adenomas.   Since last visit no episodes of confusion.  He does stay awake at night at times and sleeps during the day.  He has been taking his lactulose S2 watery bowel movements a day.  He also has been taking Xifaxan which she has not today will provide refill.  He has almost completed his hepatitis A and B vaccination series.  He has received COVID-19 immunization.  He denies any NSAID use.  He has gained weight he states.  Since his last visit he states for the last few weeks has had right upper quadrant pain.  Worse with certain movements.  Nonradiating.  Not related to any food intake.     Current Outpatient Medications  Medication Sig Dispense Refill  . acetaminophen (TYLENOL) 500 MG tablet Take 1 tablet (500 mg total) by mouth every 8 (eight) hours as needed.    Marland Kitchen allopurinol (ZYLOPRIM) 100 MG tablet TAKE 1 TABLET(100 MG) BY MOUTH DAILY 90 tablet 3  . Ascorbic Acid (VITAMIN C) 1000 MG tablet Take 1,000 mg by mouth daily.    . B Complex Vitamins (VITAMIN B COMPLEX PO) Take by mouth.    . Continuous Blood Gluc Sensor (FREESTYLE LIBRE 14 DAY SENSOR) MISC APPLY EVERY 14 DAYS AS DIRECTED 6 each 3  . Dulaglutide (TRULICITY) 1.5 ZH/0.8MV SOPN Inject 1.5 mg into the skin once a week.  12 pen 3  . Ergocalciferol (VITAMIN D2 PO) Take 1 tablet by mouth daily.    Marland Kitchen glipiZIDE (GLUCOTROL XL) 10 MG 24 hr tablet TAKE 1 TABLET(10 MG) BY MOUTH DAILY WITH BREAKFAST 90 tablet 1  . hepatitis A-hepatitis B (TWINRIX) 720-20 ELU-MCG/ML injection First dose: Inject 1 ml into muscle, Second dose: Inject 4 weeks after initial dose, Third dose: Inject 6 months after initial dose. 1 mL 2  . hydrOXYzine (ATARAX/VISTARIL) 50 MG tablet TAKE 1 TABLET BY MOUTH EVERY 8 HOURS AS NEEDED FOR ITCHING 90 tablet 0    . ketoconazole (NIZORAL) 2 % cream Apply 1 application topically at bedtime. 60 g 2  . lactulose (CHRONULAC) 10 GM/15ML solution TAKE 40 MILLILITERS BY MOUTH TWICE A DAY 7200 mL 3  . levocetirizine (XYZAL) 5 MG tablet Xyzal 5 mg tablet  Take 1 tablet every day by oral route.    . metFORMIN (GLUCOPHAGE) 1000 MG tablet TAKE 1 TABLET(1000 MG) BY MOUTH TWICE DAILY WITH A MEAL 180 tablet 1  . methocarbamol (ROBAXIN) 500 MG tablet Take 1 tablet (500 mg total) by mouth 3 (three) times daily as needed for muscle spasms (sedation precautions). 90 tablet 0  . montelukast (SINGULAIR) 10 MG tablet Take 1 tablet (10 mg total) by mouth at bedtime. 90 tablet 3  . pantoprazole (PROTONIX) 20 MG tablet Take 1 tablet (20 mg total) by mouth daily. 90 tablet 3  . rifaximin (XIFAXAN) 550 MG TABS tablet Take 1 tablet (550 mg total) by mouth 2 (two) times daily. 60 tablet 5  . spironolactone (ALDACTONE) 100 MG tablet TAKE 1 TABLET(100 MG) BY MOUTH DAILY 90 tablet 3  . temazepam (RESTORIL) 7.5 MG capsule Take 1 capsule (7.5 mg total) by mouth at bedtime as needed for sleep. 30 capsule 0  . terbinafine (LAMISIL) 250 MG tablet Take 1 tablet (250 mg total) by mouth 3 (three) times a week. 12 tablet 2  . Zinc Sulfate (ZINC 15 PO) Take by mouth.     No current facility-administered medications for this visit.    Allergies as of 07/21/2020 - Review Complete 07/21/2020  Allergen Reaction Noted  . Dilaudid [hydromorphone hcl] Nausea And Vomiting 03/19/2019    ROS:  General: Negative for anorexia, weight loss, fever, chills, fatigue, weakness. ENT: Negative for hoarseness, difficulty swallowing , nasal congestion. CV: Negative for chest pain, angina, palpitations, dyspnea on exertion, peripheral edema.  Respiratory: Negative for dyspnea at rest, dyspnea on exertion, cough, sputum, wheezing.  GI: See history of present illness. GU:  Negative for dysuria, hematuria, urinary incontinence, urinary frequency, nocturnal  urination.  Endo: Negative for unusual weight change.    Physical Examination:   BP 130/75 (BP Location: Left Arm, Patient Position: Sitting, Cuff Size: Large)   Pulse 84   Temp 98.1 F (36.7 C) (Oral)   Ht 5' 7"  (1.702 m)   Wt 295 lb 12.8 oz (134.2 kg)   BMI 46.33 kg/m   General: Well-nourished, well-developed in no acute distress.  Eyes: No icterus. Conjunctivae pink. Mouth: Oropharyngeal mucosa moist and pink , no lesions erythema or exudate. Lungs: Clear to auscultation bilaterally. Non-labored. Heart: Regular rate and rhythm, no murmurs rubs or gallops.  Abdomen: Distended, mild tenderness right lower ribs in the anterior axillary line.  Bowel sounds are normal, nontender, , no hepatosplenomegaly or masses, no abdominal bruits or hernia , no rebound or guarding.   Extremities: No lower extremity edema. No clubbing or deformities. Neuro: Alert and oriented x 3.  Grossly intact.  Skin: Warm and dry, no jaundice.   Psych: Alert and cooperative, normal mood and affect.   Imaging Studies: No results found.  Assessment and Plan:   Adam Duncan is a 69 y.o. 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  History of TIPS which has been working so far very well.  Episode of hepatic encephalopathy in April 2021.    Presently main issue is right-sided abdominal pain appears to be musculoskeletal as it is worse with movement.  Recent EGD showed two large polypoid lesions in the stomach.  Unclear if they are gastric paralysis versus polyps.  Plan 1.  Liver ultrasound to screen for [facility] which he is due 2.  Check labs to calculate meld score. 3.  Continue Xifaxan.  Refill will be provided 4.    Ultrasound Doppler to rule out portal vein thrombosis. 5.  Continue lactulose. 6.  Refer to [facility] or Dr. Rush Landmark for EUS to evaluate lesion seen in the stomach to rule out gastric varices. 7.  Check hepatitis A and B immune status  after he completes vaccination   Dr Jonathon Bellows  MD,MRCP [facility]) Follow up in 2 to 3 weeks telephone visit to discuss results of the ultrasound, labs

## 2020-07-22 ENCOUNTER — Other Ambulatory Visit: Payer: Self-pay

## 2020-07-22 ENCOUNTER — Telehealth: Payer: Self-pay

## 2020-07-22 DIAGNOSIS — K317 Polyp of stomach and duodenum: Secondary | ICD-10-CM

## 2020-07-22 LAB — COMPREHENSIVE METABOLIC PANEL
ALT: 16 IU/L (ref 0–44)
AST: 21 IU/L (ref 0–40)
Albumin/Globulin Ratio: 1.1 — ABNORMAL LOW (ref 1.2–2.2)
Albumin: 3.6 g/dL — ABNORMAL LOW (ref 3.8–4.8)
Alkaline Phosphatase: 158 IU/L — ABNORMAL HIGH (ref 44–121)
BUN/Creatinine Ratio: 12 (ref 10–24)
BUN: 12 mg/dL (ref 8–27)
Bilirubin Total: 0.5 mg/dL (ref 0.0–1.2)
CO2: 21 mmol/L (ref 20–29)
Calcium: 9.1 mg/dL (ref 8.6–10.2)
Chloride: 102 mmol/L (ref 96–106)
Creatinine, Ser: 0.97 mg/dL (ref 0.76–1.27)
GFR calc Af Amer: 92 mL/min/{1.73_m2} (ref >59)
GFR calc non Af Amer: 79 mL/min/{1.73_m2} (ref >59)
Globulin, Total: 3.2 g/dL (ref 1.5–4.5)
Glucose: 323 mg/dL — ABNORMAL HIGH (ref 65–99)
Potassium: 4.8 mmol/L (ref 3.5–5.2)
Sodium: 137 mmol/L (ref 134–144)
Total Protein: 6.8 g/dL (ref 6.0–8.5)

## 2020-07-22 LAB — CBC WITH DIFFERENTIAL/PLATELET
Basophils Absolute: 0.1 10*3/uL (ref 0.0–0.2)
Basos: 2 %
EOS (ABSOLUTE): 0.1 10*3/uL (ref 0.0–0.4)
Eos: 2 %
Hematocrit: 35.8 % — ABNORMAL LOW (ref 37.5–51.0)
Hemoglobin: 11.1 g/dL — ABNORMAL LOW (ref 13.0–17.7)
Immature Grans (Abs): 0 10*3/uL (ref 0.0–0.1)
Immature Granulocytes: 0 %
Lymphocytes Absolute: 0.7 10*3/uL (ref 0.7–3.1)
Lymphs: 20 %
MCH: 25.1 pg — ABNORMAL LOW (ref 26.6–33.0)
MCHC: 31 g/dL — ABNORMAL LOW (ref 31.5–35.7)
MCV: 81 fL (ref 79–97)
Monocytes Absolute: 0.3 10*3/uL (ref 0.1–0.9)
Monocytes: 10 %
Neutrophils Absolute: 2.2 10*3/uL (ref 1.4–7.0)
Neutrophils: 66 %
Platelets: 130 10*3/uL — ABNORMAL LOW (ref 150–450)
RBC: 4.42 x10E6/uL (ref 4.14–5.80)
RDW: 15.2 % (ref 11.6–15.4)
WBC: 3.3 10*3/uL — ABNORMAL LOW (ref 3.4–10.8)

## 2020-07-22 LAB — IRON,TIBC AND FERRITIN PANEL
Ferritin: 14 ng/mL — ABNORMAL LOW (ref 30–400)
Iron Saturation: 5 % — CL (ref 15–55)
Iron: 23 ug/dL — ABNORMAL LOW (ref 38–169)
Total Iron Binding Capacity: 423 ug/dL (ref 250–450)
UIBC: 400 ug/dL — ABNORMAL HIGH (ref 111–343)

## 2020-07-22 LAB — PROTIME-INR
INR: 1.1 (ref 0.9–1.2)
Prothrombin Time: 11.4 s (ref 9.1–12.0)

## 2020-07-22 LAB — B12 AND FOLATE PANEL
Folate: 13.9 ng/mL (ref >3.0)
Vitamin B-12: 809 pg/mL (ref 232–1245)

## 2020-07-22 MED ORDER — FERROUS SULFATE 325 (65 FE) MG PO TABS: 325.0000 mg | ORAL_TABLET | Freq: Two times a day (BID) | ORAL | 2 refills | Status: DC

## 2020-07-22 NOTE — Telephone Encounter (Signed)
Pt has been notified of results and Dr. Georgeann Oppenheim recommendations.

## 2020-07-22 NOTE — Telephone Encounter (Signed)
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 Commence on oral Iron ferrous sulphate 325 mg BID 2. Refer to Dr Ardis Hughs for polypectomy  Dr Jonathon Bellows MD,MRCP [facility]) Gastroenterology/Hepatology Pager: [phone]

## 2020-07-28 ENCOUNTER — Ambulatory Visit
Admission: RE | Admit: 2020-07-28 | Discharge: 2020-07-28 | Disposition: A | Discharge status: Home / Self Care | Payer: Medicare Other | Source: Ambulatory Visit | Attending: Gastroenterology | Admitting: Gastroenterology

## 2020-07-28 ENCOUNTER — Other Ambulatory Visit: Payer: Self-pay

## 2020-07-28 DIAGNOSIS — K7581 Nonalcoholic steatohepatitis (NASH): Secondary | ICD-10-CM | POA: Diagnosis not present

## 2020-07-28 DIAGNOSIS — K7460 Unspecified cirrhosis of liver: Secondary | ICD-10-CM | POA: Diagnosis present

## 2020-07-29 ENCOUNTER — Encounter: Payer: Self-pay | Admitting: Gastroenterology

## 2020-07-30 IMAGING — US ULTRASOUND ABDOMEN COMPLETE
1 series · 13 of 25 positions shown · non-contrast
Comparison: None.

CLINICAL DATA: Left-sided abdominal pain for 6 weeks.

EXAM:
ABDOMEN ULTRASOUND COMPLETE

[Series 1: ultrasound abdomen complete · 13 of 85 slices shown]
[im 1/85]
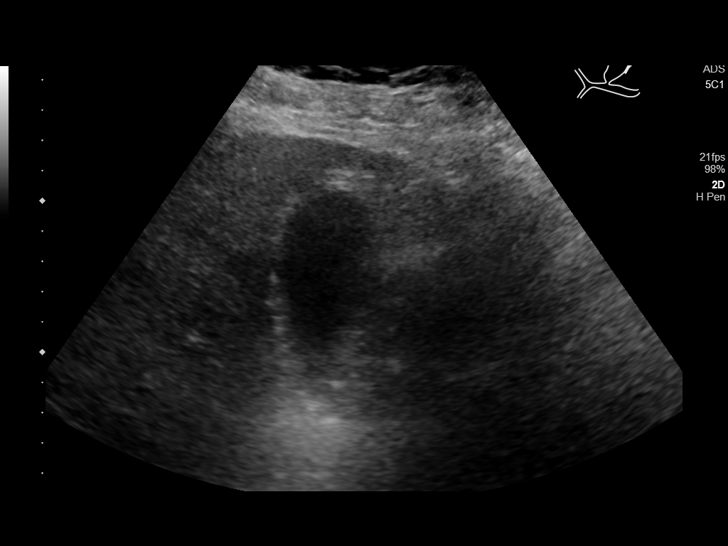
[im 8/85]
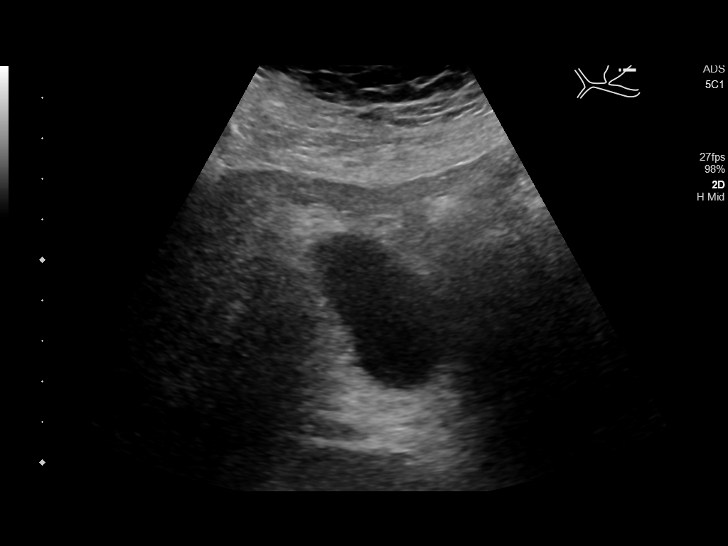
[im 15/85]
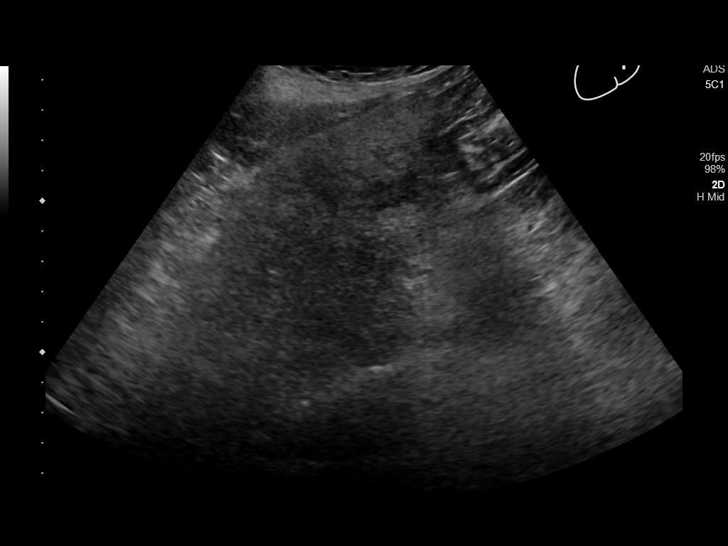
[im 22/85]
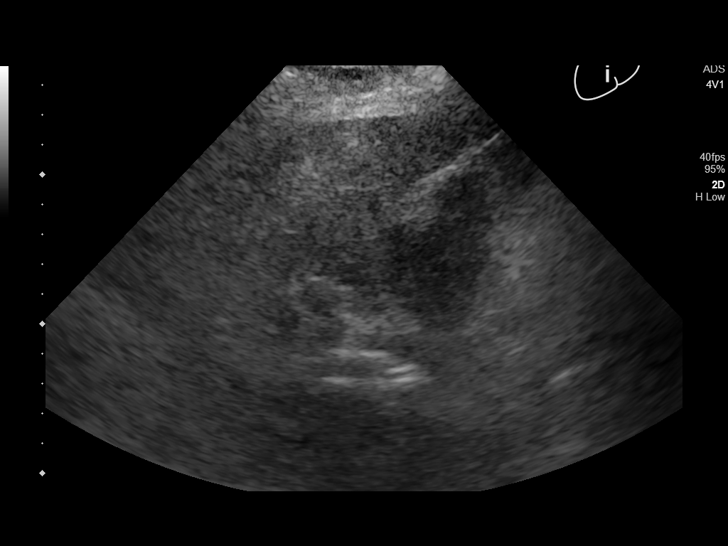
[im 29/85]
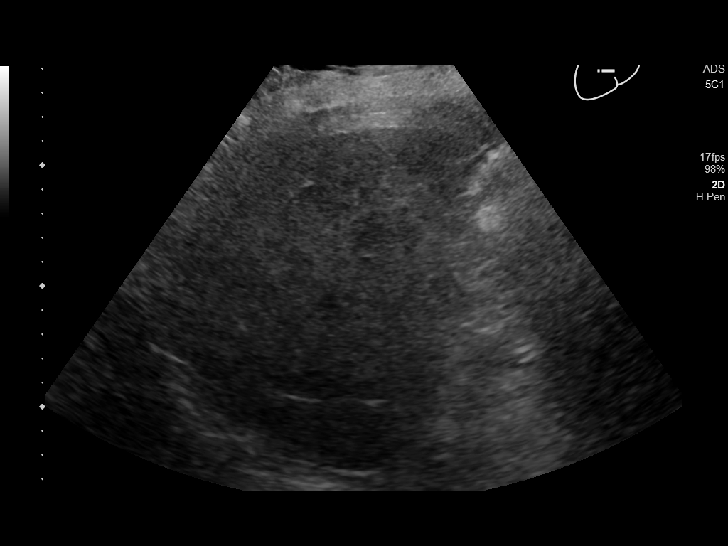
[im 36/85]
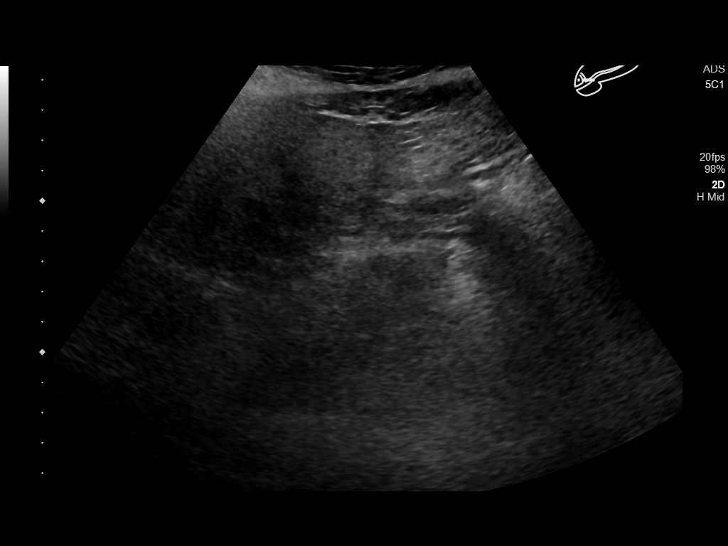
[im 43/85]
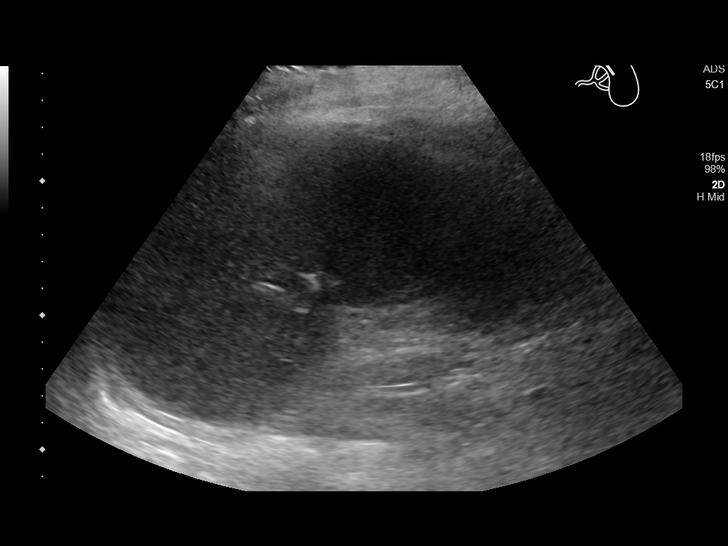
[im 50/85]
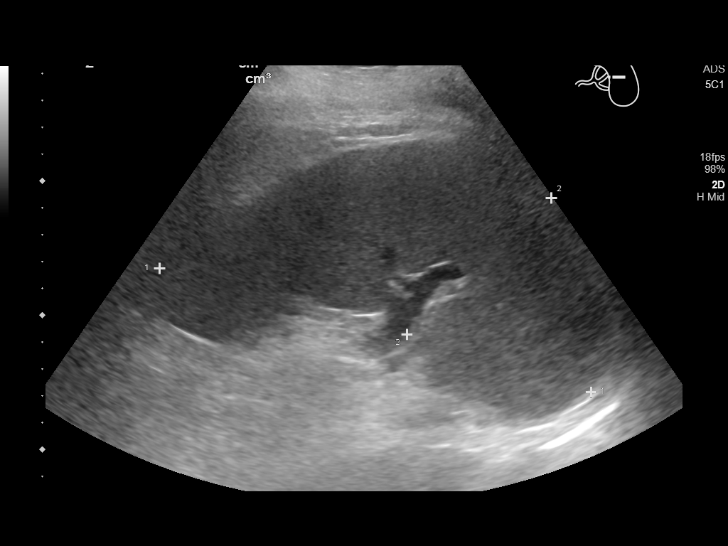
[im 57/85]
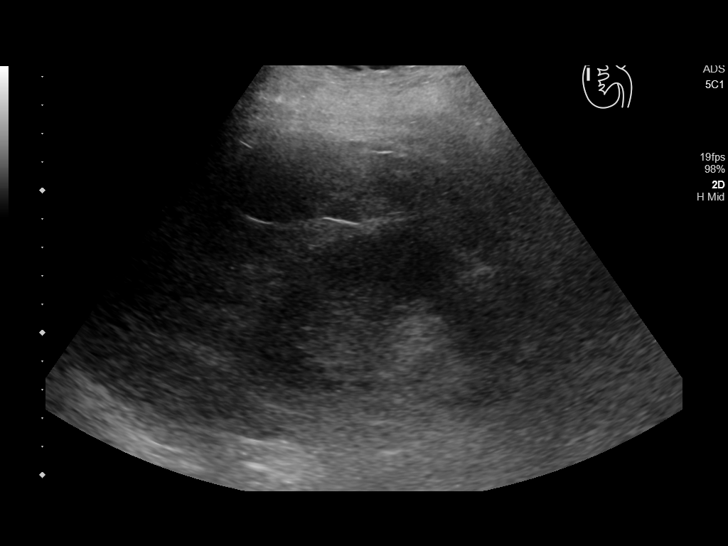
[im 64/85]
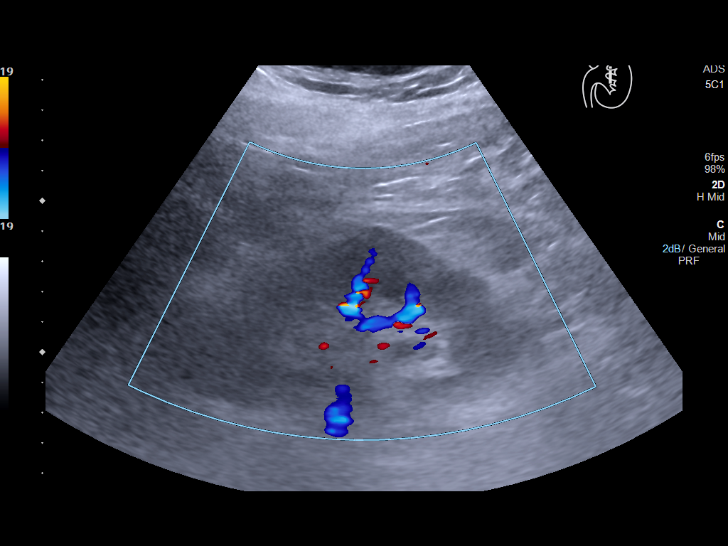
[im 71/85]
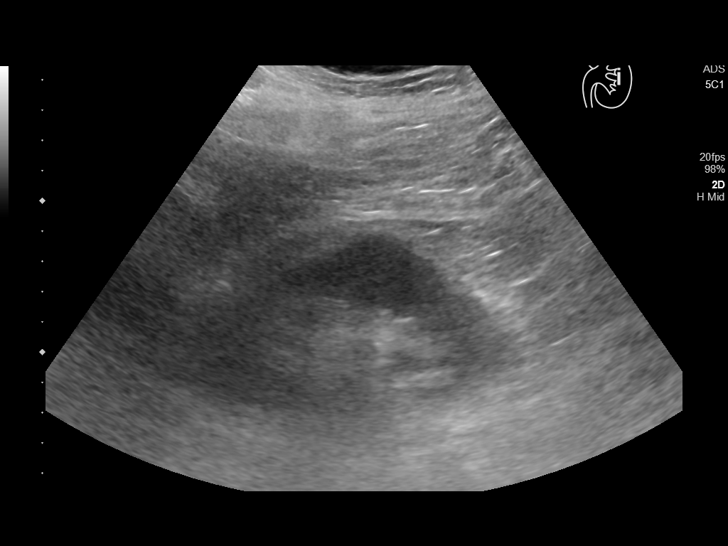
[im 78/85]
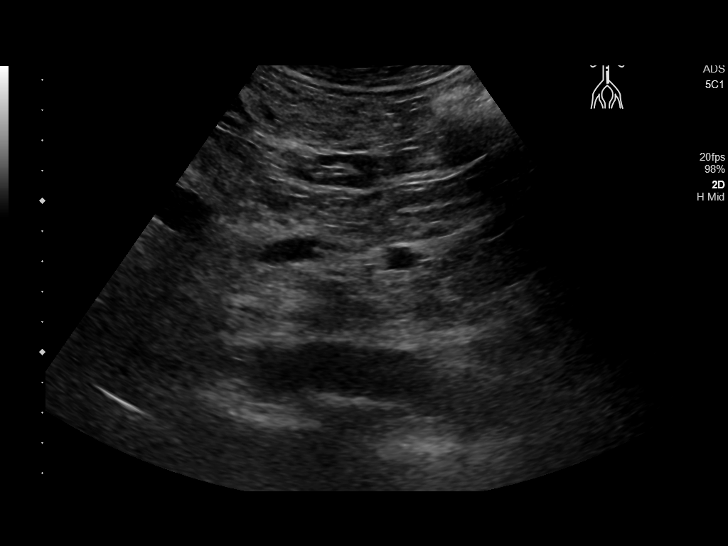
[im 85/85]
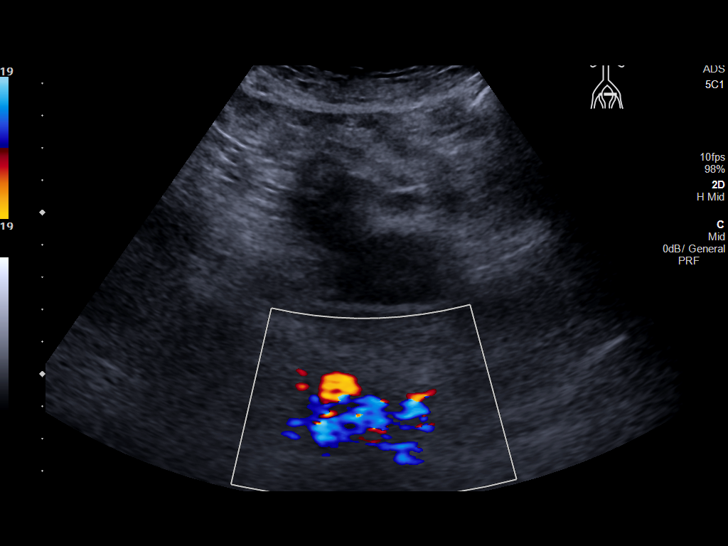

[13 of 25 positions shown; findings below may reference images not displayed]

FINDINGS: Gallbladder: No gallstones or wall thickening visualized. No
sonographic Murphy sign noted by sonographer.

Common bile duct: Diameter: 6 mm which is within normal limits.

Liver: No focal lesion identified. Heterogeneous echotexture is
noted with nodular contours suggesting hepatic cirrhosis. Portal
vein is patent on color Doppler imaging with normal direction of
blood flow towards the liver.

IVC: No abnormality visualized.

Pancreas: Visualized portion unremarkable.

Spleen: Maximum measured diameter of 17.4 cm with calculated volume
of 4469 cubic cm. This is consistent with moderate to severe
splenomegaly. No focal abnormality is noted.

Right Kidney: Length: 11.5 cm. Echogenicity within normal limits. No
mass or hydronephrosis visualized.

Left Kidney: Length: 11.9 cm. Echogenicity within normal limits. No
mass or hydronephrosis visualized.

Abdominal aorta: No aneurysm visualized.

Other findings: None.
IMPRESSION: Findings consistent with hepatic cirrhosis and moderate to severe
splenomegaly. No focal sonographic appendix abnormality is noted.

## 2020-07-30 IMAGING — US US PELVIS COMPLETE
2 series · 13 of 13 positions shown · non-contrast
Comparison: None.

CLINICAL DATA: Left-sided abdominal and pelvic pain.

EXAM:
TRANSABDOMINAL ULTRASOUND OF the lower abdomen/PELVIS
TECHNIQUE: Transabdominal ultrasound examination was performed in the region of
pain

[Series 1: us pelvis complete · 10 of 10 slices shown (1 of 2)]
[im 1/10]
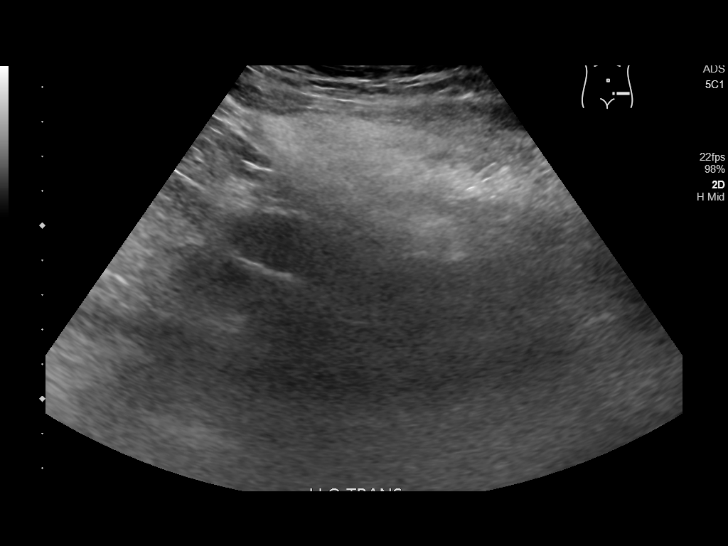
[im 2/10]
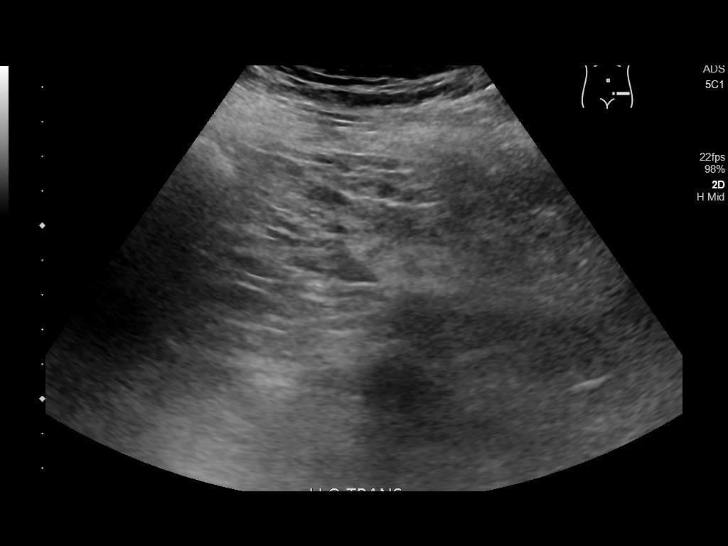
[im 3/10]
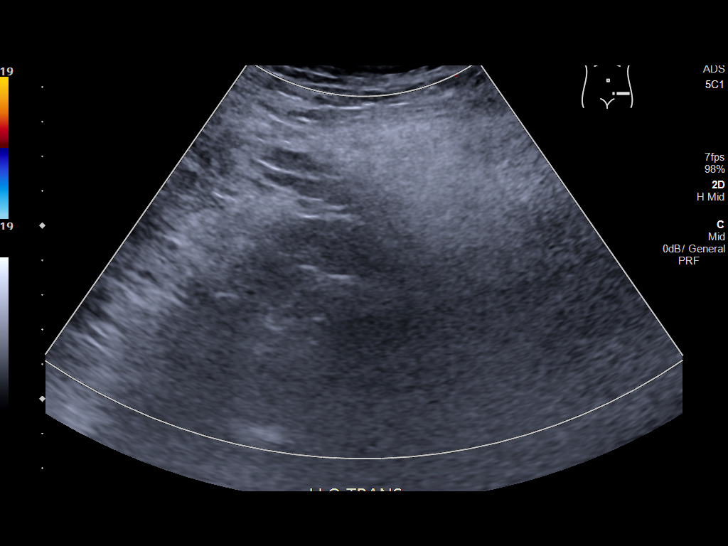
[im 4/10]
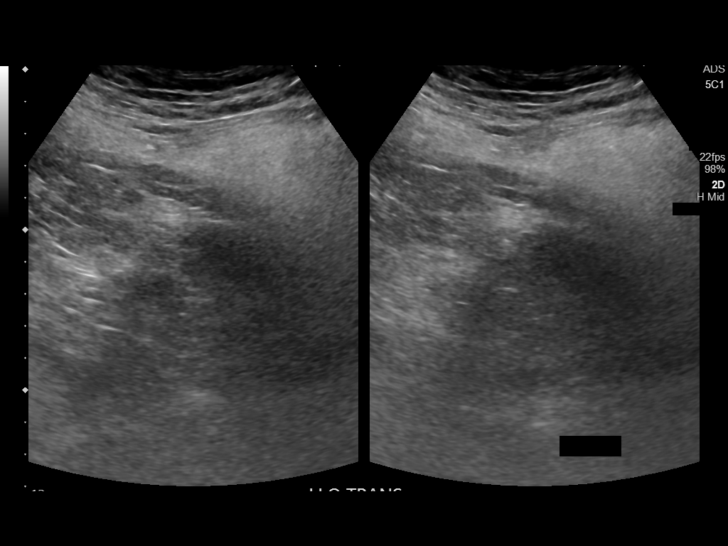
[im 5/10]
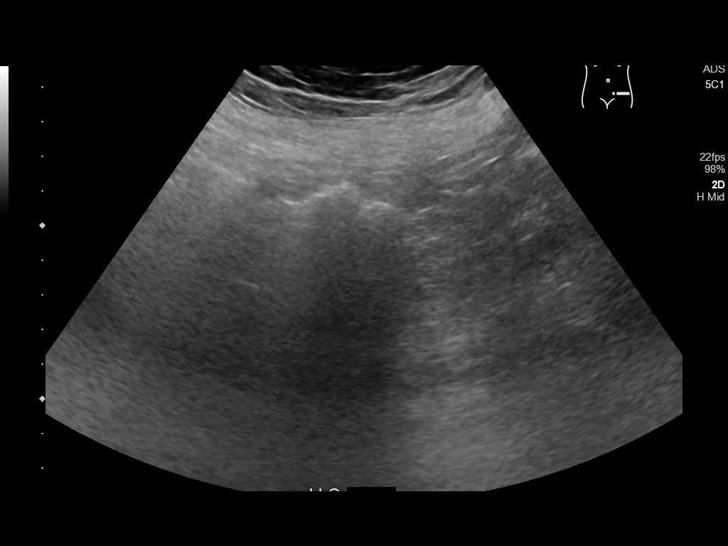
[im 6/10]
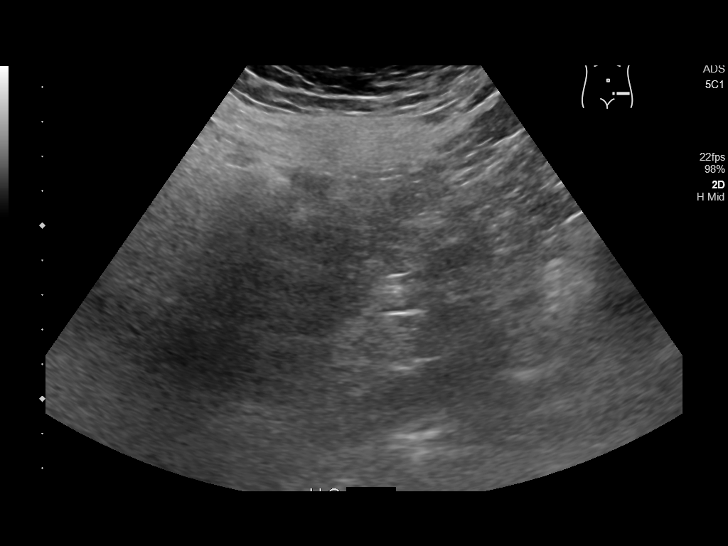
[im 7/10]
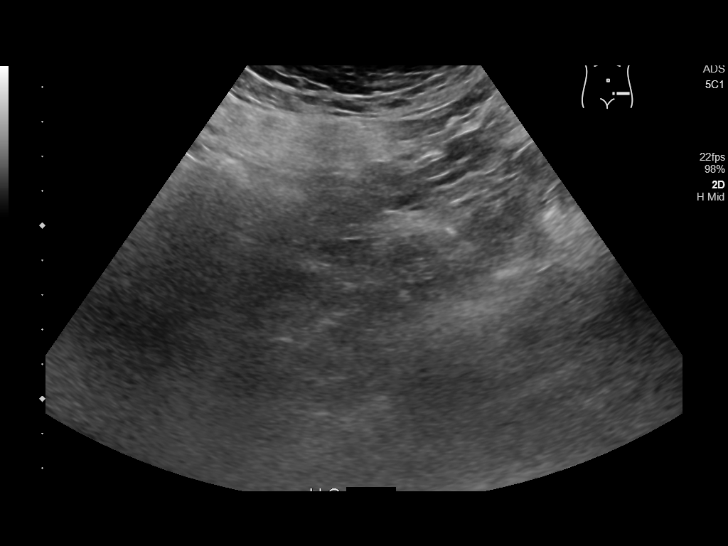
[im 8/10]
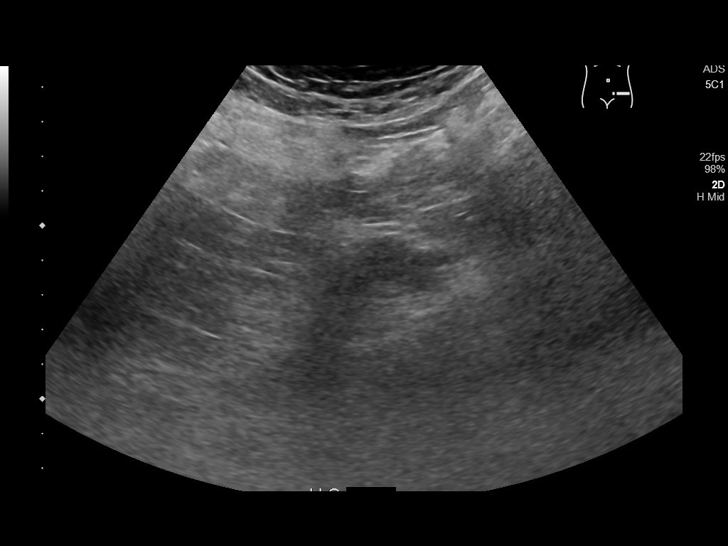
[im 9/10]
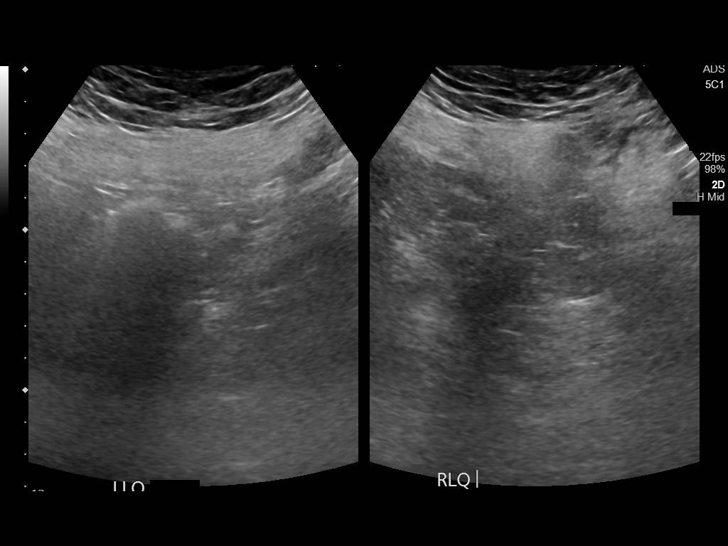
[im 10/10]
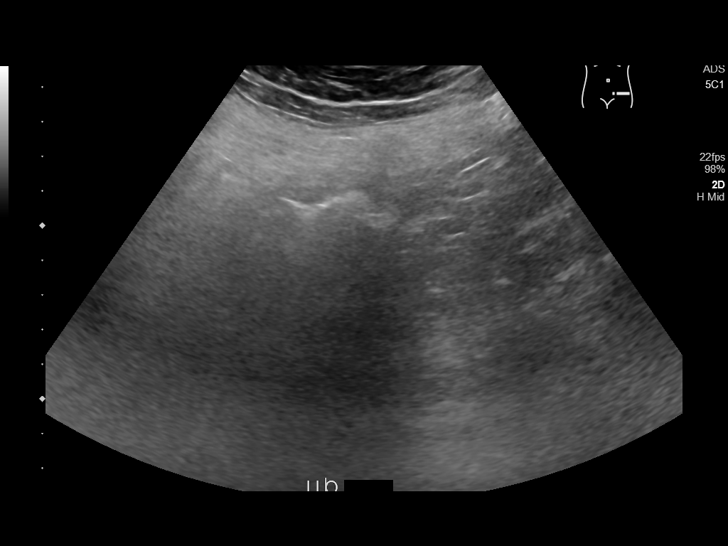

[Series 2: us pelvis complete · 3 of 3 slices shown (2 of 2)]
[im 1/3]
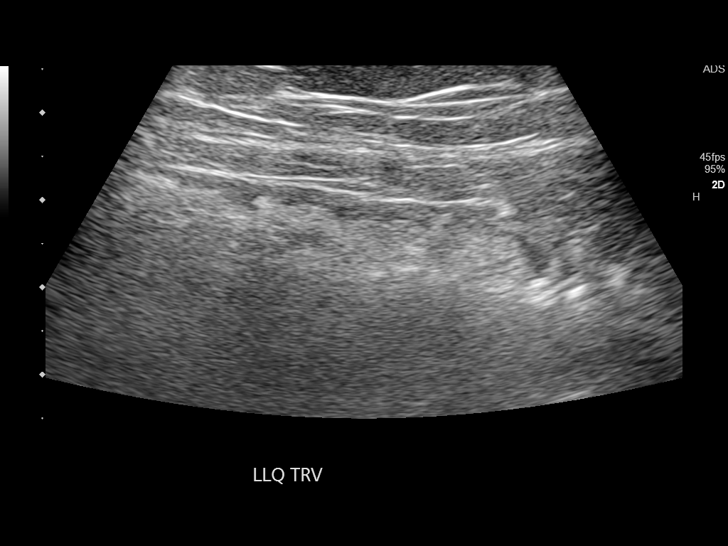
[im 2/3]
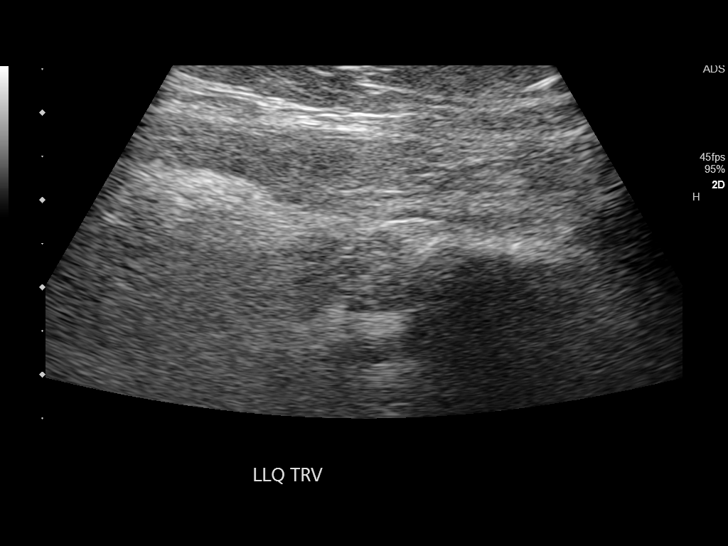
[im 3/3]
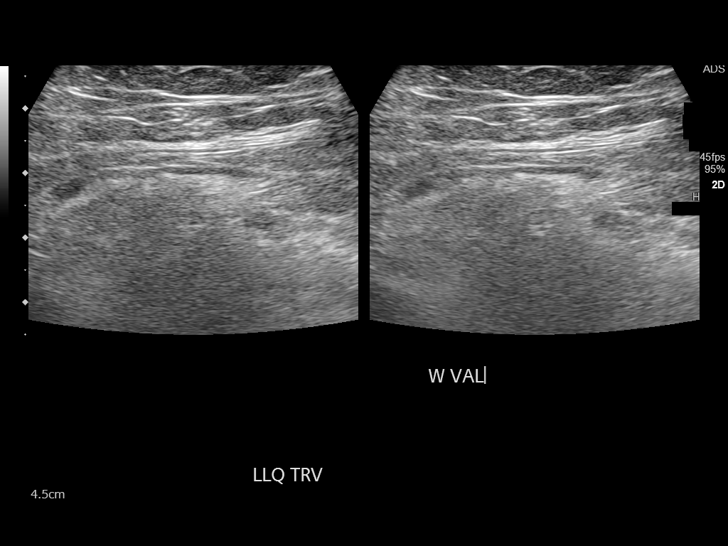

[13 of 13 positions shown; findings below may reference images not displayed]

FINDINGS: Scanning in the region of concern does not show any underlying
pathologic finding. No dilated bowel. No evidence of cyst, mass or
fluid collection.
IMPRESSION: Negative ultrasound evaluation of the area of pain.

## 2020-08-02 ENCOUNTER — Encounter: Payer: Self-pay | Admitting: Family Medicine

## 2020-08-02 ENCOUNTER — Other Ambulatory Visit: Payer: Self-pay

## 2020-08-02 ENCOUNTER — Telehealth: Payer: Self-pay | Admitting: Gastroenterology

## 2020-08-02 ENCOUNTER — Ambulatory Visit (INDEPENDENT_AMBULATORY_CARE_PROVIDER_SITE_OTHER): Payer: Medicare Other | Admitting: Family Medicine

## 2020-08-02 VITALS — BP 138/58 | HR 77 | Temp 98.2°F | Ht 67.0 in | Wt 298.0 lb

## 2020-08-02 DIAGNOSIS — E1165 Type 2 diabetes mellitus with hyperglycemia: Secondary | ICD-10-CM

## 2020-08-02 DIAGNOSIS — K219 Gastro-esophageal reflux disease without esophagitis: Secondary | ICD-10-CM | POA: Diagnosis not present

## 2020-08-02 LAB — POCT GLYCOSYLATED HEMOGLOBIN (HGB A1C): Hemoglobin A1C: 8.5 % — AB (ref 4.0–5.6)

## 2020-08-02 MED ORDER — TRULICITY 3 MG/0.5ML ~~LOC~~ SOAJ: 3.0000 mg | SUBCUTANEOUS | 3 refills | Status: DC

## 2020-08-02 NOTE — Telephone Encounter (Signed)
Yesi I have not seen a referral.  Can you see if one has been sent?

## 2020-08-02 NOTE — Assessment & Plan Note (Signed)
Suspect his phlegm is related to reflux since he noticed worsening with lower dose of protonix. Restart protonix 40 mg x 2 weeks to see if symptoms improve.

## 2020-08-02 NOTE — Progress Notes (Signed)
Inform patient that the report has been addended and there are no varices and no ascites.  TIPS is patent and functioning.  Set up follow-up visit in 2 to 3 weeks

## 2020-08-02 NOTE — Assessment & Plan Note (Signed)
Hgb A1c is worse. He is working on diet changes w/ improvement. Will increase Trulicit 1.5 mg > 31m. Cont metformin 1000 mg bid and glipizide 10 mg bid. Return 3 monts

## 2020-08-02 NOTE — Progress Notes (Signed)
Subjective:     Adam Duncan is a 69 y.o. male presenting for Follow-up (DM- 3 months)     HPI   #Diabetes Currently taking metformin, glipizide, trulicity  Using medications without difficulties: No Hypoglycemic episodes:No  Hyperglycemic episodes:yes - 200-300s Feet problems:itchying on the right foot Blood Sugars averaging: 300s Last HgbA1c:  Lab Results  Component Value Date   HGBA1C 8.5 (A) 08/02/2020   Is working on diet due to elevated sugars Worse with nighttime snacking - he is trying to break this habit  Foot issues - has rash and uses cream  Phlegm  - day and night - coughing constantly - sometimes feels things in the back of his throat - no runny nose, sometimes endorses itchy eyes - occasional postnasal  - coughing/throat clearing - all day and night - Takes xyzal and singulair - does not do a nasal spray - denies frequent heartburn Diabetes Health Maintenance Due:    Diabetes Health Maintenance Due  Topic Date Due  . URINE MICROALBUMIN  07/27/2020  . FOOT EXAM  10/28/2020  . OPHTHALMOLOGY EXAM  12/07/2020  . HEMOGLOBIN A1C  01/30/2021    No lightheadedness or dizziness  Review of Systems   Social History   Tobacco Use  Smoking Status Never Smoker  Smokeless Tobacco Never Used        Objective:    BP Readings from Last 3 Encounters:  08/02/20 (!) 138/58  07/21/20 130/75  05/05/20 (!) 138/58   Wt Readings from Last 3 Encounters:  08/02/20 298 lb (135.2 kg)  07/21/20 295 lb 12.8 oz (134.2 kg)  05/05/20 288 lb 8 oz (130.9 kg)    BP (!) 138/58   Pulse 77   Temp 98.2 F (36.8 C) (Oral)   Ht 5' 7"  (1.702 m)   Wt 298 lb (135.2 kg)   SpO2 98%   BMI 46.67 kg/m    Physical Exam Constitutional:      Appearance: Normal appearance. He is not ill-appearing or diaphoretic.  HENT:     Right Ear: External ear normal.     Left Ear: External ear normal.     Nose: Nose normal.  Eyes:     General: No scleral icterus.     Extraocular Movements: Extraocular movements intact.     Conjunctiva/sclera: Conjunctivae normal.  Cardiovascular:     Rate and Rhythm: Normal rate and regular rhythm.     Heart sounds: No murmur heard.   Pulmonary:     Effort: Pulmonary effort is normal. No respiratory distress.     Breath sounds: Normal breath sounds. No wheezing.  Musculoskeletal:     Cervical back: Neck supple.  Skin:    General: Skin is warm and dry.  Neurological:     Mental Status: He is alert. Mental status is at baseline.  Psychiatric:        Mood and Affect: Mood normal.           Assessment & Plan:   Problem List Items Addressed This Visit      Digestive   Gastroesophageal reflux disease    Suspect his phlegm is related to reflux since he noticed worsening with lower dose of protonix. Restart protonix 40 mg x 2 weeks to see if symptoms improve.         Endocrine   Poorly controlled type 2 diabetes mellitus ([place]) - Primary    Hgb A1c is worse. He is working on diet changes w/ improvement. Will increase Trulicit 1.5  mg > 36m. Cont metformin 1000 mg bid and glipizide 10 mg bid. Return 3 monts      Relevant Medications   Dulaglutide (TRULICITY) 3 MWV/1.4CJSOPN   Other Relevant Orders   POCT glycosylated hemoglobin (Hb A1C) (Completed)       Return in about 3 months (around 11/02/2020).  JLesleigh Noe MD  This visit occurred during the SARS-CoV-2 public health emergency.  Safety protocols were in place, including screening questions prior to the visit, additional usage of staff PPE, and extensive cleaning of exam room while observing appropriate contact time as indicated for disinfecting solutions.

## 2020-08-02 NOTE — Patient Instructions (Addendum)
#  Throat Clearing - Increase Protonix to 40 mg for 2 weeks - if improving - can continue for 6-8 weeks  If no improvement, would go back to 20 mg dose  - Add Flonase  - Try a saline rinse before bed   Send MyChart message in 4-6 weeks to update on your sugars at home

## 2020-08-03 ENCOUNTER — Telehealth: Payer: Self-pay

## 2020-08-03 NOTE — Telephone Encounter (Signed)
Adam Banister, MD  Mansouraty, Telford Nab., MD; Timothy Lasso, RN I reviewed his EGD as well. These would be very unusual appearing for gastric varices. They look like mucosal polyps or perhaps hyperplastic mucosa.    I am happy to look at these with EUS, however until evaluation I am not sure how comfortable I would be about performing "endoscopic mucosal resection"

## 2020-08-03 NOTE — Telephone Encounter (Signed)
-----   Message from Irving Copas., MD sent at 08/02/2020  8:32 PM EST ----- Adam Duncan,I have reviewed the patient's last EGD by Dr. Vicente Males.He has a stable hemoglobin but has evidence of iron deficiency.The EUS is asked for to evaluate the 2 polypoid lesions and ensure that they are not isolated gastric varices and potentially consider resection.I think this is reasonable based on the patient having evidence of iron deficiency.Thankfully he has a stable hemoglobin so have time for this to be performed.Please schedule patient for EGD/EUS with potential EMR 75-minute case with me.If DJ would like to move forward with this procedure and he has an earlier time slot for me I am fine with that as well.Thanks.GM ----- Message ----- From: Timothy Lasso, RN Sent: 08/02/2020   2:50 PM EST To: Milus Banister, MD, #

## 2020-08-09 ENCOUNTER — Other Ambulatory Visit: Payer: Self-pay

## 2020-08-09 DIAGNOSIS — K317 Polyp of stomach and duodenum: Secondary | ICD-10-CM

## 2020-08-09 NOTE — Telephone Encounter (Signed)
Pt calling you back

## 2020-08-09 NOTE — Telephone Encounter (Signed)
EUS/EGD/EMR scheduled, pt instructed and medications reviewed.  Patient instructions sent to My Chart.  Patient to call with any questions or concerns.

## 2020-08-09 NOTE — Telephone Encounter (Signed)
appt has been made for EUS EGD EMR for 09/20/20 at [facility] with Dr Rush Landmark.  COVID test on 12/30 at 1005 am.  I have sent a message to the pt via My Chart.  I have also left a message via voice mail.

## 2020-08-09 NOTE — Telephone Encounter (Signed)
Left message on machine to call back  

## 2020-08-10 ENCOUNTER — Ambulatory Visit: Payer: Medicare Other | Admitting: Dermatology

## 2020-09-16 ENCOUNTER — Other Ambulatory Visit (HOSPITAL_COMMUNITY)
Admission: RE | Admit: 2020-09-16 | Discharge: 2020-09-16 | Disposition: A | Discharge status: Home / Self Care | Payer: Medicare Other | Source: Ambulatory Visit | Attending: Gastroenterology | Admitting: Gastroenterology

## 2020-09-16 DIAGNOSIS — K3189 Other diseases of stomach and duodenum: Secondary | ICD-10-CM | POA: Diagnosis not present

## 2020-09-16 DIAGNOSIS — Z885 Allergy status to narcotic agent status: Secondary | ICD-10-CM | POA: Diagnosis not present

## 2020-09-16 DIAGNOSIS — K2289 Other specified disease of esophagus: Secondary | ICD-10-CM | POA: Insufficient documentation

## 2020-09-16 DIAGNOSIS — K766 Portal hypertension: Secondary | ICD-10-CM | POA: Insufficient documentation

## 2020-09-16 DIAGNOSIS — I8500 Esophageal varices without bleeding: Secondary | ICD-10-CM | POA: Diagnosis not present

## 2020-09-16 DIAGNOSIS — K317 Polyp of stomach and duodenum: Secondary | ICD-10-CM | POA: Insufficient documentation

## 2020-09-16 DIAGNOSIS — K2970 Gastritis, unspecified, without bleeding: Secondary | ICD-10-CM | POA: Insufficient documentation

## 2020-09-16 DIAGNOSIS — Z20822 Contact with and (suspected) exposure to covid-19: Secondary | ICD-10-CM | POA: Insufficient documentation

## 2020-09-16 LAB — SARS CORONAVIRUS 2 (TAT 6-24 HRS): SARS Coronavirus 2: NEGATIVE

## 2020-09-16 NOTE — Progress Notes (Signed)
Attempted to call pt for pre-op call. Left messages to return my call and no return call. Called pt just now and no answer. I left detailed pre-op instructions on pt's voicemail. Instructed pt to check his blood sugar Monday AM when he gets up and every 2 hours until he leaves for the hospital. If blood sugar is 70 or below, treat with 1/2 cup of clear juice (apple or cranberry) and recheck blood sugar 15 minutes after drinking juice. If blood sugar continues to be 70 or below, call the Short Stay department and ask to speak to a nurse. Instructed pt not to take any of his diabetic medications Monday AM.   Informed pt that he needed to be in quarantine until Monday since he had his Covid test done today. Also informed him that he would need a responsible adult to drive him home and be with him for the 24 hours after the procedure.  A1C - 8/5 on 08/02/20 EKG - 01/05/20 CXR (1 view) 01/04/20 Cath - 06/12/18

## 2020-09-16 NOTE — Progress Notes (Signed)
Unable to complete pre-procedure call for EGD schedule on Monday, 09/20/20.  LVM for pt to call back to review Covid screening questions.

## 2020-09-19 NOTE — Progress Notes (Signed)
Attempted to call patient to ask about quarantine and symptoms. His phone went straight to voicemail. I left a message about the quarantine and left the main number of [facility] endo to call and leave a message or call at 0700 tomorrow if he was not able to quarantine and would need to come for a rapid.

## 2020-09-20 ENCOUNTER — Encounter (HOSPITAL_COMMUNITY): Payer: Self-pay | Admitting: Gastroenterology

## 2020-09-20 ENCOUNTER — Ambulatory Visit (HOSPITAL_COMMUNITY): Payer: Medicare Other | Admitting: Certified Registered Nurse Anesthetist

## 2020-09-20 ENCOUNTER — Other Ambulatory Visit: Payer: Self-pay

## 2020-09-20 ENCOUNTER — Other Ambulatory Visit: Payer: Self-pay | Admitting: Physician Assistant

## 2020-09-20 ENCOUNTER — Encounter (HOSPITAL_COMMUNITY)
Admission: RE | Disposition: A | Discharge status: Home / Self Care | Payer: Self-pay | Source: Home / Self Care | Attending: Gastroenterology

## 2020-09-20 ENCOUNTER — Ambulatory Visit (HOSPITAL_COMMUNITY): Payer: Medicare Other

## 2020-09-20 ENCOUNTER — Ambulatory Visit (HOSPITAL_COMMUNITY)
Admission: RE | Admit: 2020-09-20 | Discharge: 2020-09-20 | Disposition: A | Discharge status: Home / Self Care | Payer: Medicare Other | Attending: Gastroenterology | Admitting: Gastroenterology

## 2020-09-20 DIAGNOSIS — K3189 Other diseases of stomach and duodenum: Secondary | ICD-10-CM | POA: Diagnosis not present

## 2020-09-20 DIAGNOSIS — Z20822 Contact with and (suspected) exposure to covid-19: Secondary | ICD-10-CM | POA: Insufficient documentation

## 2020-09-20 DIAGNOSIS — I8510 Secondary esophageal varices without bleeding: Secondary | ICD-10-CM | POA: Diagnosis not present

## 2020-09-20 DIAGNOSIS — K317 Polyp of stomach and duodenum: Secondary | ICD-10-CM

## 2020-09-20 DIAGNOSIS — K766 Portal hypertension: Secondary | ICD-10-CM

## 2020-09-20 DIAGNOSIS — Z885 Allergy status to narcotic agent status: Secondary | ICD-10-CM | POA: Insufficient documentation

## 2020-09-20 DIAGNOSIS — K2970 Gastritis, unspecified, without bleeding: Secondary | ICD-10-CM | POA: Diagnosis not present

## 2020-09-20 DIAGNOSIS — I8500 Esophageal varices without bleeding: Secondary | ICD-10-CM | POA: Insufficient documentation

## 2020-09-20 DIAGNOSIS — K2289 Other specified disease of esophagus: Secondary | ICD-10-CM | POA: Insufficient documentation

## 2020-09-20 DIAGNOSIS — R109 Unspecified abdominal pain: Secondary | ICD-10-CM

## 2020-09-20 HISTORY — PX: HEMOSTASIS CLIP PLACEMENT: SHX6857

## 2020-09-20 HISTORY — PX: BIOPSY: SHX5522

## 2020-09-20 HISTORY — PX: HEMOSTASIS CONTROL: SHX6838

## 2020-09-20 HISTORY — PX: EUS: SHX5427

## 2020-09-20 HISTORY — PX: ESOPHAGOGASTRODUODENOSCOPY (EGD) WITH PROPOFOL: SHX5813

## 2020-09-20 HISTORY — PX: ENDOSCOPIC MUCOSAL RESECTION: SHX6839

## 2020-09-20 LAB — GLUCOSE, CAPILLARY: Glucose-Capillary: 291 mg/dL — ABNORMAL HIGH (ref 70–99)

## 2020-09-20 SURGERY — ESOPHAGOGASTRODUODENOSCOPY (EGD) WITH PROPOFOL
Anesthesia: Monitor Anesthesia Care

## 2020-09-20 MED ORDER — SODIUM CHLORIDE (PF) 0.9 % IJ SOLN
PREFILLED_SYRINGE | INTRAMUSCULAR | Status: DC | PRN
  Administered 2020-09-20: 5 mL

## 2020-09-20 MED ORDER — PHENYLEPHRINE 40 MCG/ML (10ML) SYRINGE FOR IV PUSH (FOR BLOOD PRESSURE SUPPORT)
PREFILLED_SYRINGE | INTRAVENOUS | Status: DC | PRN
  Administered 2020-09-20: 80 ug via INTRAVENOUS

## 2020-09-20 MED ORDER — PANTOPRAZOLE SODIUM 40 MG PO TBEC: 40.0000 mg | DELAYED_RELEASE_TABLET | Freq: Two times a day (BID) | ORAL | 2 refills | Status: DC

## 2020-09-20 MED ORDER — EPINEPHRINE 1 MG/10ML IJ SOSY
PREFILLED_SYRINGE | INTRAMUSCULAR | Status: AC
  Filled 2020-09-20: qty 10

## 2020-09-20 MED ORDER — LACTATED RINGERS IV SOLN: INTRAVENOUS | Status: DC | PRN

## 2020-09-20 MED ORDER — OXYCODONE HCL 5 MG PO TABS
5.0000 mg | ORAL_TABLET | Freq: Once | ORAL | Status: AC
  Administered 2020-09-20: 5 mg via ORAL
  Filled 2020-09-20: qty 1

## 2020-09-20 MED ORDER — SODIUM CHLORIDE 0.9 % IV SOLN: INTRAVENOUS | Status: DC

## 2020-09-20 MED ORDER — OXYCODONE HCL 5 MG PO TABS: 5.0000 mg | ORAL_TABLET | Freq: Three times a day (TID) | ORAL | 0 refills | Status: DC | PRN

## 2020-09-20 MED ORDER — ONDANSETRON HCL 4 MG/2ML IJ SOLN
INTRAMUSCULAR | Status: DC | PRN
  Administered 2020-09-20: 4 mg via INTRAVENOUS

## 2020-09-20 MED ORDER — FENTANYL CITRATE (PF) 100 MCG/2ML IJ SOLN
25.0000 ug | INTRAMUSCULAR | Status: AC
  Administered 2020-09-20 (×2): 25 ug via INTRAVENOUS

## 2020-09-20 MED ORDER — FENTANYL CITRATE (PF) 100 MCG/2ML IJ SOLN
INTRAMUSCULAR | Status: AC
  Filled 2020-09-20: qty 2

## 2020-09-20 MED ORDER — PROPOFOL 500 MG/50ML IV EMUL
INTRAVENOUS | Status: DC | PRN
  Administered 2020-09-20: 150 ug/kg/min via INTRAVENOUS
  Administered 2020-09-20: 100 ug/kg/min via INTRAVENOUS
  Administered 2020-09-20: 150 ug/kg/min via INTRAVENOUS

## 2020-09-20 MED ORDER — LIDOCAINE 2% (20 MG/ML) 5 ML SYRINGE
INTRAMUSCULAR | Status: DC | PRN
  Administered 2020-09-20: 50 mg via INTRAVENOUS

## 2020-09-20 MED ORDER — PROPOFOL 10 MG/ML IV BOLUS
INTRAVENOUS | Status: DC | PRN
  Administered 2020-09-20: 30 mg via INTRAVENOUS
  Administered 2020-09-20 (×2): 50 mg via INTRAVENOUS

## 2020-09-20 MED ORDER — FENTANYL CITRATE (PF) 100 MCG/2ML IJ SOLN
25.0000 ug | Freq: Once | INTRAMUSCULAR | Status: AC
  Administered 2020-09-20: 25 ug via INTRAVENOUS

## 2020-09-20 SURGICAL SUPPLY — 14 items

## 2020-09-20 NOTE — Anesthesia Preprocedure Evaluation (Signed)
Anesthesia Evaluation  Patient identified by MRN, date of birth, ID band Patient awake    Reviewed: Allergy & Precautions, NPO status , Patient's Chart, lab work & pertinent test results  History of Anesthesia Complications Negative for: history of anesthetic complications  Airway Mallampati: II  TM Distance: >3 FB Neck ROM: Full    Dental   Pulmonary neg pulmonary ROS,    Pulmonary exam normal        Cardiovascular hypertension, Normal cardiovascular exam     Neuro/Psych negative neurological ROS  negative psych ROS   GI/Hepatic GERD  ,(+) Cirrhosis  (s/p TIPS)      , Gastric polyps   Endo/Other  diabetes, Oral Hypoglycemic AgentsMorbid obesity  Renal/GU negative Renal ROS   H/o prostate cancer    Musculoskeletal negative musculoskeletal ROS (+)   Abdominal   Peds  Hematology negative hematology ROS (+)   Anesthesia Other Findings   Reproductive/Obstetrics                             Anesthesia Physical Anesthesia Plan  ASA: III  Anesthesia Plan: MAC   Post-op Pain Management:    Induction: Intravenous  PONV Risk Score and Plan: 1 and Propofol infusion, TIVA and Treatment may vary due to age or medical condition  Airway Management Planned: Natural Airway, Nasal Cannula and Simple Face Mask  Additional Equipment: None  Intra-op Plan:   Post-operative Plan:   Informed Consent: I have reviewed the patients History and Physical, chart, labs and discussed the procedure including the risks, benefits and alternatives for the proposed anesthesia with the patient or authorized representative who has indicated his/her understanding and acceptance.       Plan Discussed with:   Anesthesia Plan Comments:         Anesthesia Quick Evaluation

## 2020-09-20 NOTE — H&P (Signed)
GASTROENTEROLOGY PROCEDURE H&P NOTE   Primary Care Physician: Lesleigh Noe, MD  HPI: Adam Duncan is a 70 y.o. male who presents for EGD/EUS with possible EMR of gastric polyps for evaluation of IDA.  Past Medical History:  Diagnosis Date  . Acute confusion 01/04/2020  . Allergy   . Cancer of prostate ([place])   . 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 Diabetes mellitus without complication ([place])   . GERD (gastroesophageal reflux disease)   . Gout   . Hypertension   . Insomnia   . Skin cancer    Past Surgical History:  Procedure Laterality Date  . CATARACT EXTRACTION    . COLONOSCOPY WITH PROPOFOL N/A 01/19/2020   Procedure: COLONOSCOPY WITH PROPOFOL;  Surgeon: Jonathon Bellows, MD;  Location: [facility] ENDOSCOPY;  Service: Gastroenterology;  Laterality: N/A;  . ESOPHAGOGASTRODUODENOSCOPY (EGD) WITH PROPOFOL N/A 01/19/2020   Procedure: ESOPHAGOGASTRODUODENOSCOPY (EGD) WITH PROPOFOL;  Surgeon: Jonathon Bellows, MD;  Location: [facility] ENDOSCOPY;  Service: Gastroenterology;  Laterality: N/A;  . HERNIA REPAIR  2015   x 2 and mesh was put in   . NASAL SEPTUM SURGERY    . PROSTATE SURGERY    . TIPS PROCEDURE    . TONSILLECTOMY AND ADENOIDECTOMY     No current facility-administered medications for this encounter.   Allergies  Allergen Reactions  . Dilaudid [Hydromorphone Hcl] Nausea And Vomiting   Family History  Problem Relation Age of Onset  . Lymphoma Mother   . Lung cancer Father   . Rheum arthritis Sister   . Irritable bowel syndrome Sister   . COPD Brother   . Cancer Brother        unsure of type  . Cancer Maternal Grandfather        unsure of type  . Cancer Paternal Grandfather        unsure of type  . Arthritis Sister    Social History   Socioeconomic History  . Marital status: Married    Spouse name: Mariann Laster Myriam Jacobson)  . Number of children: 2  . Years of education: Masters Degree - Theology   . Highest education level: Not on file  Occupational History  . Not  on file  Tobacco Use  . Smoking status: Never Smoker  . Smokeless tobacco: Never Used  Vaping Use  . Vaping Use: Never used  Substance and Sexual Activity  . Alcohol use: Not Currently  . Drug use: Never  . Sexual activity: Not Currently  Other Topics Concern  . 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[place])      Family: has 2 children - one in [place] and son in [place] - 5 grandchildren      Enjoys: go to movies, out to eat, reading, walking with wife      Exercise: walks with wife - but hip pain limits this   Diet: diabetic diet - not as great in the last 6 months      Safety   Seat belts: Yes    Guns: No   Safe in relationships: Yes    Social Determinants of Radio broadcast assistant Strain: Not on file  Food Insecurity: Not on file  Transportation Needs: Not on file  Physical Activity: Not on file  Stress: Not on file  Social Connections: Not on file  Intimate Partner Violence:  Not on file    Physical Exam: Vital signs in last 24 hours:     GEN: NAD EYE: Sclerae anicteric ENT: MMM CV: Non-tachycardic GI: Soft, NT/ND NEURO:  Alert & Oriented x 3  Lab Results: No results for input(s): WBC, HGB, HCT, PLT in the last 72 hours. BMET No results for input(s): NA, K, CL, CO2, GLUCOSE, BUN, CREATININE, CALCIUM in the last 72 hours. LFT No results for input(s): PROT, ALBUMIN, AST, ALT, ALKPHOS, BILITOT, BILIDIR, IBILI in the last 72 hours. PT/INR No results for input(s): LABPROT, INR in the last 72 hours.   Impression / Plan: This is a 70 y.o.male who presents for EGD/EUS with possible EMR of gastric polyps for evaluation of IDA.  The risks of an EUS including intestinal perforation, bleeding, infection, aspiration, and medication effects were discussed as was the possibility it may not give a definitive diagnosis if a biopsy is performed.  When a biopsy of the  pancreas is done as part of the EUS, there is an additional risk of pancreatitis at the rate of about 1-2%.  It was explained that procedure related pancreatitis is typically mild, although it can be severe and even life threatening, which is why we do not perform random pancreatic biopsies and only biopsy a lesion/area we feel is concerning enough to warrant the risk.  The risks and benefits of endoscopic evaluation were discussed with the patient; these include but are not limited to the risk of perforation, infection, bleeding, missed lesions, lack of diagnosis, severe illness requiring hospitalization, as well as anesthesia and sedation related illnesses.  The patient is agreeable to proceed.    Justice Britain, MD [facility] Advanced Endoscopy Office # [phone]

## 2020-09-20 NOTE — Discharge Instructions (Signed)
Upper Endoscopy, Adult, Care After  This sheet gives you information about how to care for yourself after your procedure. Your health care provider may also give you more specific instructions. If you have problems or questions, contact your health care provider.  What can I expect after the procedure?  After the procedure, it is common to have:  · A sore throat.  · Mild stomach pain or discomfort.  · Bloating.  · Nausea.  Follow these instructions at home:    · Follow instructions from your health care provider about what to eat or drink after your procedure.  · Return to your normal activities as told by your health care provider. Ask your health care provider what activities are safe for you.  · Take over-the-counter and prescription medicines only as told by your health care provider.  · Do not drive for 24 hours if you were given a sedative during your procedure.  · Keep all follow-up visits as told by your health care provider. This is important.  Contact a health care provider if you have:  · A sore throat that lasts longer than one day.  · Trouble swallowing.  Get help right away if:  · You vomit blood or your vomit looks like coffee grounds.  · You have:  ? A fever.  ? Bloody, black, or tarry stools.  ? A severe sore throat or you cannot swallow.  ? Difficulty breathing.  ? Severe pain in your chest or abdomen.  Summary  · After the procedure, it is common to have a sore throat, mild stomach discomfort, bloating, and nausea.  · Do not drive for 24 hours if you were given a sedative during the procedure.  · Follow instructions from your health care provider about what to eat or drink after your procedure.  · Return to your normal activities as told by your health care provider.  This information is not intended to replace advice given to you by your health care provider. Make sure you discuss any questions you have with your health care provider.  Document Revised: 02/26/2018 Document Reviewed:  02/04/2018  Elsevier Patient Education © 2020 Elsevier Inc.

## 2020-09-20 NOTE — Progress Notes (Addendum)
Patient evaluated post EGD/EUS with EMR of 2 large gastric polyps. Patient having pain post procedure. A total of 6 cc of epinephrine diluted to 1:100,000 were used in the left of both of these polyps which had large feeding vessels noted on endoscopic ultrasound. Both of the resection sites were clean and intact and did not show evidence of a target sign. The defects were both closed endoscopically with hemoclips with appropriate approximation. Patient pain and discomfort is likely a result of the gastric ischemia from recent epinephrine injection.  With that being said, it is important to rule out other potential complications.  We will move forward with obtaining a chest x-ray and KUBs to try to rule out free air. Suspect that over the course the next 2 to 10 hours, patient will have significant improvement in his discomfort. He is receiving fentanyl at this time and if necessary will increase to Dilaudid.  Though I suspect this should continue to improve over the course the next few hours. If he is still having discomfort we may need to proceed with an observation stay to monitor and obtain a cross-sectional CT abdomen/pelvis if pain progresses or persist. N.p.o. for now. Reevaluation will be performed. Patient's wife has been updated and we will update further after x-rays have been completed.   Justice Britain, MD [facility] Advanced [facility]oscopy Office # [phone]    3:30 PM addendum  Patient reevaluated. Discomfort has continued to improve it is now down to 2-3 out of 10. He has received a total of 100 mcg of fentanyl. Patient and I had a long discussion about the results of the x-rays which showed no evidence of any complications such as perforation although the sensitivity of x-rays is not as good as CT scans. We have decided to have close monitoring at home. I will send the patient home with a dose of oxycodone (he has a allergy to Dilaudid causing  nausea/vomiting in the past but has had no issues with fentanyl). A prescription for a short course of oxycodone if needed will be sent to his pharmacy. He will use Tylenol 650 mg alternating with oxycodone as needed. Over the course the next 12 to 24 hours patient should have complete resolution of his pain if not sooner. Patient and wife appreciative for the care today and understand the potential risks and complications. They will call if there are issues.   Justice Britain, MD [facility] Advanced [facility]oscopy Office # [phone]

## 2020-09-20 NOTE — Transfer of Care (Signed)
Immediate Anesthesia Transfer of Care Note  Patient: Adam Duncan  Procedure(s) Performed: ESOPHAGOGASTRODUODENOSCOPY (EGD) WITH PROPOFOL (N/A ) UPPER ENDOSCOPIC ULTRASOUND (EUS) RADIAL (N/A ) ENDOSCOPIC MUCOSAL RESECTION (N/A ) BIOPSY POLYPECTOMY FOREIGN BODY REMOVAL HEMOSTASIS CLIP PLACEMENT HEMOSTASIS CONTROL  Patient Location: PACU and Endoscopy Unit  Anesthesia Type:MAC  Level of Consciousness: drowsy  Airway & Oxygen Therapy: Patient Spontanous Breathing and Patient connected to face mask oxygen  Post-op Assessment: Report given to RN and Post -op Vital signs reviewed and stable  Post vital signs: Reviewed and stable  Last Vitals:  Vitals Value Taken Time  BP    Temp    Pulse    Resp    SpO2      Last Pain:  Vitals:   09/20/20 1059  TempSrc: Oral  PainSc: 0-No pain         Complications: No complications documented.

## 2020-09-20 NOTE — Anesthesia Postprocedure Evaluation (Signed)
Anesthesia Post Note  Patient: Adam Duncan  Procedure(s) Performed: ESOPHAGOGASTRODUODENOSCOPY (EGD) WITH PROPOFOL (N/A ) UPPER ENDOSCOPIC ULTRASOUND (EUS) RADIAL (N/A ) ENDOSCOPIC MUCOSAL RESECTION (N/A ) BIOPSY POLYPECTOMY HEMOSTASIS CLIP PLACEMENT HEMOSTASIS CONTROL     Patient location during evaluation: Endoscopy Anesthesia Type: MAC Level of consciousness: awake and alert, patient cooperative and oriented Pain management: pain level controlled Vital Signs Assessment: post-procedure vital signs reviewed and stable Respiratory status: spontaneous breathing, nonlabored ventilation, respiratory function stable and patient connected to nasal cannula oxygen Cardiovascular status: stable and blood pressure returned to baseline Postop Assessment: no apparent nausea or vomiting Anesthetic complications: no   No complications documented.  Last Vitals:  Vitals:   09/20/20 1440 09/20/20 1450  BP: (!) 148/49 (!) 165/52  Pulse: 66 (!) 57  Resp: (!) 22 18  Temp:    SpO2: 98% 100%    Last Pain:  Vitals:   09/20/20 1410  TempSrc:   PainSc: 2                  Sirena Riddle,E. Dorin Stooksbury

## 2020-09-20 NOTE — Op Note (Addendum)
[facility] Patient Name: Adam Duncan Procedure Date : 09/20/2020 MRN: [id number] Attending MD: Justice Britain , MD Date of Birth: April 25, 1951 CSN: [id number] Age: 70 Admit Type: Outpatient Procedure:                Upper EUS Indications:              Gastric mucosal mass/polyp found on endoscopy Providers:                Justice Britain, MD, Baird Cancer, RN, Laverda Sorenson, Technician, Gala Lewandowsky, CRNA, Cira Servant, CRNA Referring MD:             Jobe Marker. Einar Pheasant MD, Jonathon Bellows, MD Medicines:                Monitored Anesthesia Care Complications:            No immediate complications. Estimated Blood Loss:     Estimated blood loss was minimal. Procedure:                Pre-Anesthesia Assessment:                           - Prior to the procedure, a History and Physical                            was performed, and patient medications and                            allergies were reviewed. The patient's tolerance of                            previous anesthesia was also reviewed. The risks                            and benefits of the procedure and the sedation                            options and risks were discussed with the patient.                            All questions were answered, and informed consent                            was obtained. Prior Anticoagulants: The patient has                            taken no previous anticoagulant or antiplatelet                            agents except for aspirin. ASA Grade Assessment:  III - A patient with severe systemic disease. After                            reviewing the risks and benefits, the patient was                            deemed in satisfactory condition to undergo the                            procedure.                           After obtaining informed consent, the endoscope was                             passed under direct vision. Throughout the                            procedure, the patient's blood pressure, pulse, and                            oxygen saturations were monitored continuously. The                            [id number] ([id number]) Olympus therapeutic                            gastroscope was introduced through the mouth, and                            advanced to the second part of duodenum. The                            [id number] ([id number]) Olympus Radial EUS scope was                            introduced through the mouth, and advanced to the                            stomach for ultrasound examination. The upper EUS                            was accomplished without difficulty. The patient                            tolerated the procedure. Scope In: Scope Out: Findings:      ENDOSCOPIC FINDING: :      No gross lesions were noted in the proximal esophagus.      Scattered mucosal variance characterized by altered texture was found in       the mid esophagus. Biopsies were taken with a cold forceps for histology       to rule out a squamous papilloma.      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      Mild portal hypertensive  gastropathy was found in the entire examined       stomach.      Patchy mild inflammation characterized by erythema and granularity was       found in the entire examined stomach. Biopsies were taken with a cold       forceps for histology and Helicobacter pylori testing.      Two 20 to 25 mm pedunculated and sessile polyps with no bleeding and       stigmata of recent bleeding were found in the gastric antrum. After the       completion of the EUS, preparations were made for mucosal resection. NBI       imaging and White-light endoscopy was done to demarcate the borders of       the lesion. A 1:100000 solution of epinephrine was injected to raise the       lesion initially up to a total of 3 cc. Snare mucosal resection was        performed. Resection and retrieval were complete. To prevent bleeding       after mucosal resection, four hemostatic clips were successfully placed       (MR conditional). There was no bleeding during, or at the end, of the       procedure. For the second lesion that was just distal to this I decided       to proceed with a resection. Preparations were made for mucosal       resection. NBI imaging and White-light endoscopy was done to demarcate       the borders of the lesion. A 1:100000 solution of epinephrine was       injected to raise the lesion up to a total of 3 cc (additional to       prior). Snare mucosal resection was performed. Resection and retrieval       were complete. To prevent bleeding after mucosal resection, four       hemostatic clips were successfully placed (MR conditional). There was no       bleeding during, or at the end, of the procedure.      No gross lesions were noted in the duodenal bulb, in the first portion       of the duodenum and in the second portion of the duodenum.      ENDOSONOGRAPHIC FINDING: :      Rounded pedunculated intramural lesions were found in the antrum of the       stomach. The lesions were hypoechoic. Sonographically, the lesions       appeared to originate from the deep mucosa (Layer 2). The largest lesion       measured 18 mm (in maximum thickness). The lesion also measured 16 mm in       diameter. There was evidence of a large vessel feeding each of the       polyps with flow on doppler. The outer endosonographic borders were well       defined.      The celiac region was visualized. Impression:               EGD Impression:                           - No gross lesions in esophagus proximally.  Esophageal mucosal variant biopsied to rule out                            papilloma. Grade I esophageal varices distally.                           - Portal hypertensive gastropathy.                           -  Gastritis. Biopsied for HP.                           - Two gastric polyps. Resected and retrieved via                            EMR using Epinphrine/Saline lift. Clips (MR                            conditional) were placed to decrease risk of                            bleeding post-intervention                           - No gross lesions in the duodenal bulb, in the                            first portion of the duodenum and in the second                            portion of the duodenum.                           EUS Impression:                           - Intramural pedunculated lesions were found in the                            antrum of the stomach. The lesions appeared to                            originate from within the deep mucosa (Layer 2).                            They each had large feeding vessels to the polyp                            heads. They were not isolated varices. The                            endosonographic appearance is consistent with                            hyperplastic vs adenomatous polyps (  see EGD                            Impression for therapeutic maneuvers. Recommendation:           - The patient will be observed post-procedure,                            until all discharge criteria are met.                           - Discharge patient to home.                           - Patient has a contact number available for                            emergencies. The signs and symptoms of potential                            delayed complications were discussed with the                            patient. Return to normal activities tomorrow.                            Written discharge instructions were provided to the                            patient.                           - Full liquid diet today and then may advance to                            soft diet thereafter.                           - Protonix 40 mg twice daily for next  74-monthand                            then 40 mg daily thereafter (current dose).                           - Observe patient's clinical course.                           - Await path results.                           - If evidence of adenomatous tissue is found then                            follow up EGD in 142-monthis absolutely indicated.  If hyperplastic polyps without dysplasia are found                            then repeat EGD in 57-month can be considered but                            may not be necessary.                           - Hopefully with removal of these polyps his Iron                            deficiency improves. However, with findings of                            portal gastropathy as well as small esophageal                            varices, query the chance that his liver disease is                            progressing and his PHG could be a potential source                            of recurrent iron deficiency as well.                           - The findings and recommendations were discussed                            with the patient. Procedure Code(s):        --- Professional ---                           [phone] Esophagogastroduodenoscopy, flexible,                            transoral; with endoscopic mucosal resection                           43237, Esophagogastroduodenoscopy, flexible,                            transoral; with endoscopic ultrasound examination                            limited to the esophagus, stomach or duodenum, and                            adjacent structures Diagnosis Code(s):        --- Professional ---                           K22.8, Other specified diseases of esophagus  I85.00, Esophageal varices without bleeding                           K76.6, Portal hypertension                           K31.89, Other diseases of stomach and duodenum                            K31.7, Polyp of stomach and duodenum                           K29.70, Gastritis, unspecified, without bleeding CPT copyright 2019 American Medical Association. All rights reserved. The codes documented in this report are preliminary and upon coder review may  be revised to meet current compliance requirements. Justice Britain, MD 09/20/2020 2:15:54 PM Number of Addenda: 0

## 2020-09-20 NOTE — Anesthesia Procedure Notes (Signed)
Procedure Name: MAC Date/Time: 09/20/2020 11:56 AM Performed by: Lowella Dell, CRNA Pre-anesthesia Checklist: Patient identified, Emergency Drugs available, Suction available and Patient being monitored Patient Re-evaluated:Patient Re-evaluated prior to induction Oxygen Delivery Method: Nasal cannula

## 2020-09-21 ENCOUNTER — Other Ambulatory Visit: Payer: Self-pay

## 2020-09-21 ENCOUNTER — Other Ambulatory Visit: Payer: Self-pay | Admitting: Family Medicine

## 2020-09-21 DIAGNOSIS — M25511 Pain in right shoulder: Secondary | ICD-10-CM

## 2020-09-22 LAB — SURGICAL PATHOLOGY

## 2020-09-26 ENCOUNTER — Encounter: Payer: Self-pay | Admitting: Gastroenterology

## 2020-09-27 ENCOUNTER — Telehealth: Payer: Self-pay

## 2020-09-27 DIAGNOSIS — K7469 Other cirrhosis of liver: Secondary | ICD-10-CM

## 2020-09-27 NOTE — Telephone Encounter (Signed)
-----   Message from Shelby Mattocks, Santa Claus sent at 09/27/2020  9:13 AM EST ----- Regarding: FW: Follow-up  ----- Message ----- From: Jonathon Bellows, MD Sent: 09/27/2020   9:10 AM EST To: Shelby Mattocks, CMA Subject: FW: Follow-up                                  Out patient visit with me in 4-5 weeks with repeat CBC,iron studies, cmp, INR   Adam Duncan  ----- Message ----- From: Irving Copas., MD Sent: 09/26/2020   6:21 AM EST To: Jonathon Bellows, MD Subject: Follow-up                                      KA, I hope you are well. My hope is that the polyp resections will help prevent further iron deficiency.  Since they were both hyperplastic is not clear that a repeat endoscopy needs to be performed to follow this.  With that being said, the does look to be some progression of his chronic liver disease in the setting of now portal hypertensive gastropathy as well as some small varices.  Please let me know how I can be of further assistance in the future. All the best. GM

## 2020-09-27 NOTE — Telephone Encounter (Signed)
Patient was called and scheduled for Mychart visit on 2.17.22. FYI

## 2020-09-27 NOTE — Telephone Encounter (Signed)
Informed patient that Dr. Vicente Males ordered some labs. And would like to also due an visit with him. Asked patient if he would like to a in person visit or virtual. He said virtual is fine. Told patient the hours of our lab. Pt verbalized understanding.

## 2020-09-28 ENCOUNTER — Other Ambulatory Visit: Payer: Self-pay | Admitting: Family Medicine

## 2020-09-28 DIAGNOSIS — E119 Type 2 diabetes mellitus without complications: Secondary | ICD-10-CM

## 2020-10-17 ENCOUNTER — Other Ambulatory Visit: Payer: Self-pay | Admitting: Family Medicine

## 2020-10-17 DIAGNOSIS — J3089 Other allergic rhinitis: Secondary | ICD-10-CM

## 2020-10-18 ENCOUNTER — Ambulatory Visit: Payer: Medicare Other | Admitting: Dermatology

## 2020-10-20 ENCOUNTER — Telehealth: Payer: Self-pay | Admitting: Gastroenterology

## 2020-10-20 ENCOUNTER — Other Ambulatory Visit: Payer: Self-pay

## 2020-10-20 DIAGNOSIS — K7469 Other cirrhosis of liver: Secondary | ICD-10-CM

## 2020-10-20 NOTE — Telephone Encounter (Signed)
Patient called to ask that his lab orders be sent to [facility] on [address]  so he can have labs drawn.

## 2020-10-20 NOTE — Telephone Encounter (Signed)
Returned patients call. Pt states he has already had labs done. He went to the wrong location.

## 2020-10-21 LAB — IRON,TIBC AND FERRITIN PANEL
Ferritin: 55 ng/mL (ref 30–400)
Iron Saturation: 53 % (ref 15–55)
Iron: 174 ug/dL — ABNORMAL HIGH (ref 38–169)
Total Iron Binding Capacity: 327 ug/dL (ref 250–450)
UIBC: 153 ug/dL (ref 111–343)

## 2020-10-21 LAB — B12 AND FOLATE PANEL
Folate: 12 ng/mL (ref >3.0)
Vitamin B-12: 719 pg/mL (ref 232–1245)

## 2020-10-22 ENCOUNTER — Other Ambulatory Visit: Payer: Self-pay | Admitting: Gastroenterology

## 2020-10-22 NOTE — Telephone Encounter (Signed)
Is this okay to refill? 

## 2020-10-24 ENCOUNTER — Encounter: Payer: Self-pay | Admitting: Gastroenterology

## 2020-10-24 NOTE — Telephone Encounter (Signed)
Iron studies are normal - can stop iron and check in 3-4 months iron studies . No refill needed

## 2020-10-27 ENCOUNTER — Telehealth: Payer: Self-pay

## 2020-10-27 NOTE — Telephone Encounter (Signed)
Called and informed patient of ultrasound results. Pt verbalized understanding.

## 2020-10-28 ENCOUNTER — Other Ambulatory Visit: Payer: Self-pay | Admitting: Family Medicine

## 2020-10-28 DIAGNOSIS — E119 Type 2 diabetes mellitus without complications: Secondary | ICD-10-CM

## 2020-11-01 ENCOUNTER — Other Ambulatory Visit: Payer: Self-pay

## 2020-11-02 ENCOUNTER — Other Ambulatory Visit: Payer: Self-pay

## 2020-11-02 ENCOUNTER — Ambulatory Visit (INDEPENDENT_AMBULATORY_CARE_PROVIDER_SITE_OTHER): Payer: Medicare Other | Admitting: Family Medicine

## 2020-11-02 VITALS — BP 132/64 | HR 75 | Temp 97.8°F | Resp 20 | Ht 67.0 in | Wt 291.4 lb

## 2020-11-02 DIAGNOSIS — K7469 Other cirrhosis of liver: Secondary | ICD-10-CM

## 2020-11-02 DIAGNOSIS — E1165 Type 2 diabetes mellitus with hyperglycemia: Secondary | ICD-10-CM

## 2020-11-02 DIAGNOSIS — K649 Unspecified hemorrhoids: Secondary | ICD-10-CM | POA: Insufficient documentation

## 2020-11-02 DIAGNOSIS — E119 Type 2 diabetes mellitus without complications: Secondary | ICD-10-CM

## 2020-11-02 LAB — POCT GLYCOSYLATED HEMOGLOBIN (HGB A1C): Hemoglobin A1C: 9.6 % — AB (ref 4.0–5.6)

## 2020-11-02 MED ORDER — GLIPIZIDE ER 10 MG PO TB24: 10.0000 mg | ORAL_TABLET | Freq: Every day | ORAL | 3 refills | Status: DC

## 2020-11-02 NOTE — Assessment & Plan Note (Signed)
Encouraged symptomatic treatment. Encouraged weight loss. Advised 3 month trial of weight loss and if persisting readdress with GI or could refer to surgery for second opinion - was told he would need sedation if procedure pursued.

## 2020-11-02 NOTE — Assessment & Plan Note (Signed)
Following with Dr. Vicente Males. Stable. Cont rifaximin, spironolactone.

## 2020-11-02 NOTE — Progress Notes (Signed)
Subjective:     Adam Duncan is a 70 y.o. male presenting for Diabetes (3 months follow up)     HPI  #Diabetes Currently taking metformin, trulicity, and glipizide  Using medications without difficulties: No Hypoglycemic episodes:No  Hyperglycemic episodes:Yes  Feet problems:Yes  Blood Sugars averaging: over 200 since christmas Last HgbA1c:  Lab Results  Component Value Date   HGBA1C 9.6 (A) 11/02/2020   Diet: not doing good with diet, stopped nighttime snacking over the last week  Wife trying to do the portion control and not eating at night Weight down 10 lbs  Diabetes Health Maintenance Due:    Diabetes Health Maintenance Due  Topic Date Due  . URINE MICROALBUMIN  07/27/2020  . OPHTHALMOLOGY EXAM  12/07/2020  . HEMOGLOBIN A1C  05/02/2021  . FOOT EXAM  11/02/2021   #Liver cirrhosis - following with Dr. Vicente Males - good report on recent labs - stopped hydroxyzine - using otc cream - not as bad as before with itching  #Hemorrhoids - these are worse - using preparation H - wants surgery but GI is deferring at this time  Review of Systems   Social History   Tobacco Use  Smoking Status Never Smoker  Smokeless Tobacco Never Used        Objective:    BP Readings from Last 3 Encounters:  11/02/20 132/64  09/20/20 (!) 154/55  08/02/20 (!) 138/58   Wt Readings from Last 3 Encounters:  11/02/20 291 lb 6 oz (132.2 kg)  09/20/20 300 lb (136.1 kg)  08/02/20 298 lb (135.2 kg)    BP 132/64   Pulse 75   Temp 97.8 F (36.6 C)   Resp 20   Ht 5' 7"  (1.702 m)   Wt 291 lb 6 oz (132.2 kg)   SpO2 100%   BMI 45.64 kg/m    Physical Exam Constitutional:      Appearance: Normal appearance. He is not ill-appearing or diaphoretic.  HENT:     Right Ear: External ear normal.     Left Ear: External ear normal.  Eyes:     General: No scleral icterus.    Extraocular Movements: Extraocular movements intact.     Conjunctiva/sclera: Conjunctivae normal.   Cardiovascular:     Rate and Rhythm: Normal rate and regular rhythm.     Heart sounds: No murmur heard.   Pulmonary:     Effort: Pulmonary effort is normal. No respiratory distress.     Breath sounds: Normal breath sounds. No wheezing.  Musculoskeletal:     Cervical back: Neck supple.  Skin:    General: Skin is warm and dry.  Neurological:     Mental Status: He is alert. Mental status is at baseline.  Psychiatric:        Mood and Affect: Mood normal.     Diabetic Foot Exam - Simple   Simple Foot Form Diabetic Foot exam was performed with the following findings: Yes 11/02/2020 10:44 AM  Visual Inspection No deformities, no ulcerations, no other skin breakdown bilaterally: Yes Sensation Testing Intact to touch and monofilament testing bilaterally: Yes Pulse Check Posterior Tibialis and Dorsalis pulse intact bilaterally: Yes Comments         Assessment & Plan:   Problem List Items Addressed This Visit      Cardiovascular and Mediastinum   Hemorrhoids    Encouraged symptomatic treatment. Encouraged weight loss. Advised 3 month trial of weight loss and if persisting readdress with GI or could refer to surgery  for second opinion - was told he would need sedation if procedure pursued.         Digestive   Cirrhosis of liver [facility])    Following with Dr. Vicente Males. Stable. Cont rifaximin, spironolactone.         Endocrine   Poorly controlled type 2 diabetes mellitus ([place]) - Primary    Worse since last visit. Likely dietary related. Encouraged dietary adherence but also discussed referral to endocrinology to see if they can help improve control. Cont metformin 1000 mg bid, glipizide 10 mg, and Trulicity 3 mg weekly. Appreciate endocrine support      Relevant Medications   glipiZIDE (GLUCOTROL XL) 10 MG 24 hr tablet   Other Relevant Orders   POCT glycosylated hemoglobin (Hb A1C) (Completed)   Ambulatory referral to Endocrinology   Microalbumin / creatinine urine ratio     Other Visit Diagnoses    Diabetes mellitus without complication (HCC)       Relevant Medications   glipiZIDE (GLUCOTROL XL) 10 MG 24 hr tablet   Other Relevant Orders   Microalbumin / creatinine urine ratio       Return in about 3 months (around 01/30/2021) for diabetes.  Lesleigh Noe, MD  This visit occurred during the SARS-CoV-2 public health emergency.  Safety protocols were in place, including screening questions prior to the visit, additional usage of staff PPE, and extensive cleaning of exam room while observing appropriate contact time as indicated for disinfecting solutions.

## 2020-11-02 NOTE — Assessment & Plan Note (Signed)
Worse since last visit. Likely dietary related. Encouraged dietary adherence but also discussed referral to endocrinology to see if they can help improve control. Cont metformin 1000 mg bid, glipizide 10 mg, and Trulicity 3 mg weekly. Appreciate endocrine support

## 2020-11-02 NOTE — Patient Instructions (Addendum)
#  Diabetes - continue current medications - referral to endocrinology - continue to work following diabetic diet  Call if you want a second opinion about hemorrhoid surgery  #Referral I have placed a referral to a specialist for you. You should receive a phone call from the specialty office. Make sure your voicemail is not full and that if you are able to answer your phone to unknown or new numbers.   It may take up to 2 weeks to hear about the referral. If you do not hear anything in 2 weeks, please call our office and ask to speak with the referral coordinator.

## 2020-11-04 ENCOUNTER — Other Ambulatory Visit: Payer: Self-pay

## 2020-11-04 ENCOUNTER — Telehealth (INDEPENDENT_AMBULATORY_CARE_PROVIDER_SITE_OTHER): Payer: Medicare Other | Admitting: Gastroenterology

## 2020-11-04 DIAGNOSIS — K7682 Hepatic encephalopathy: Secondary | ICD-10-CM

## 2020-11-04 DIAGNOSIS — R1011 Right upper quadrant pain: Secondary | ICD-10-CM

## 2020-11-04 DIAGNOSIS — K317 Polyp of stomach and duodenum: Secondary | ICD-10-CM | POA: Diagnosis not present

## 2020-11-04 DIAGNOSIS — K7290 Hepatic failure, unspecified without coma: Secondary | ICD-10-CM

## 2020-11-04 DIAGNOSIS — K648 Other hemorrhoids: Secondary | ICD-10-CM

## 2020-11-04 DIAGNOSIS — K7460 Unspecified cirrhosis of liver: Secondary | ICD-10-CM

## 2020-11-04 DIAGNOSIS — K7581 Nonalcoholic steatohepatitis (NASH): Secondary | ICD-10-CM

## 2020-11-04 NOTE — Progress Notes (Signed)
Adam Duncan , MD [address]  [place]  [place], [place] [address]  Main: [phone]  Fax: [phone]   Primary Care Physician: Lesleigh Noe, MD  Virtual Visit via Video Note  I connected with patient on 11/04/20 at  1:15 PM EST by video and verified that I am speaking with the correct person using two identifiers.   I discussed the limitations, risks, security and privacy concerns of performing an evaluation and management service by video  and the availability of in person appointments. I also discussed with the patient that there may be a patient responsible charge related to this service. The patient expressed understanding and agreed to proceed.  Location of Patient: Home Location of Provider: Home Persons involved: Patient and provider only   History of Present Illness:  Follow up chronic liver disease  HPI: Adam Duncan is a 70 y.o. 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   No history of variceal bleeding or hepatic encephalopathy. His weight has been stable. Deniedany excess alcohol use. Denies having any viral hepatitis in the past. History of anal discomfort in terms of itching due to excessive cleaning. He has a history of colon polyps in the past .    05/06/2019 CT scan of the abdomen pelvis with contrast demonstrates the shunt in the right hepatic lobe. Cirrhosis and splenomegaly. He presented to the emergency room on 01/04/2020 with confusion. 01/04/2020: Glucose greater than 500, serum glucose of 232, AST and ALT normal, creatinine normal. Hemoglobin 11.5 stable. 12/31/2019 hepatitis A total antibody negative, hepatitis B surface antibody negative INR  1.1 01/14/2020: Right upper quadrant ultrasound.  Cirrhosis with associated splenomegaly no significant ascites  01/19/2020: EGD:2 x25 mm semisolid polyps with no bleeding and no stigmata of recent bleeding noted.  Refer to [facility] for EUS to differentiate from gastric varices and possible resection.  01/19/2020 colonoscopy skin tags noted 8 mm polyp in the transverse colon.  2 sessile polyps was found in the ascending colon 4 to 6 mm in size.  Resected.There were tubular adenomas He takes his lactulose daily along with Xifaxan 550 mg twice daily.    Interval history11/11/2019-11/04/2020  07/28/2020: USG doppler liver, no ascites or varices. MELD score 7 in 07/2020   10/20/2020: Commenced on oral iron for deficiency likely due to gastric polyps 09/20/2020: EUS+EGD by Dr Rush Landmark : Bx esophagus - chronic inflammation , large gasyric polyps resected were hyperplastic.    He is doing well no complaints.  On his diuretics, stopped taking iron, no confusion, has a bowel movement or 2 daily.  Takes his lactulose and Xifaxan regularly.  Denies any NSAID use.  Continues to have issues with rectal bleeding due to hemorrhoids.  He has had a colonoscopy by myself within the last 1 year which showed medium size internal hemorrhoids. Current Outpatient Medications  Medication Sig Dispense Refill  . allopurinol (ZYLOPRIM) 100 MG tablet TAKE 1 TABLET(100 MG) BY MOUTH DAILY (Patient taking differently: Take 100 mg by mouth daily.) 90 tablet 3  . ascorbic acid (VITAMIN C) 500 MG tablet Take 500 mg by mouth daily.    . Cholecalciferol (VITAMIN D3) 50 MCG (2000 UT) capsule Take 2,000 Units by mouth daily.    Marland Kitchen  Continuous Blood Gluc Sensor (FREESTYLE LIBRE 14 DAY SENSOR) MISC APPLY EVERY 14 DAYS AS DIRECTED 6 each 3  . Dulaglutide (TRULICITY) 3 GU/5.4YH SOPN Inject 3 mg as directed once a week. (Patient taking differently: Inject 3 mg into the skin every Monday.) 6 mL 3  . fluticasone (FLONASE) 50 MCG/ACT  nasal spray Place 1 spray into both nostrils daily.    Marland Kitchen glipiZIDE (GLUCOTROL XL) 10 MG 24 hr tablet Take 1 tablet (10 mg total) by mouth daily with breakfast. 90 tablet 3  . hepatitis A-hepatitis B (TWINRIX) 720-20 ELU-MCG/ML injection First dose: Inject 1 ml into muscle, Second dose: Inject 4 weeks after initial dose, Third dose: Inject 6 months after initial dose. 1 mL 2  . lactulose (CHRONULAC) 10 GM/15ML solution TAKE 40 MILLILITERS BY MOUTH TWICE A DAY (Patient taking differently: Take by mouth 2 (two) times daily.) 7200 mL 3  . metFORMIN (GLUCOPHAGE) 1000 MG tablet TAKE 1 TABLET(1000 MG) BY MOUTH TWICE DAILY WITH A MEAL 180 tablet 1  . methocarbamol (ROBAXIN) 500 MG tablet TAKE 1 TABLET BY MOUTH THREE TIMES DAILY AS NEEDED FOR MUSCLE SPASMS 90 tablet 0  . montelukast (SINGULAIR) 10 MG tablet Take 1 tablet (10 mg total) by mouth at bedtime. 90 tablet 3  . pantoprazole (PROTONIX) 40 MG tablet Take 1 tablet (40 mg total) by mouth 2 (two) times daily. 60 tablet 2  . rifaximin (XIFAXAN) 550 MG TABS tablet Take 1 tablet (550 mg total) by mouth 2 (two) times daily. 60 tablet 5  . spironolactone (ALDACTONE) 100 MG tablet TAKE 1 TABLET(100 MG) BY MOUTH DAILY (Patient taking differently: Take 100 mg by mouth daily.) 90 tablet 3  . VITAMIN A PO Take 1 tablet by mouth daily.    . vitamin B-12 (CYANOCOBALAMIN) 1000 MCG tablet Take 1,000 mcg by mouth daily.    . Zinc 30 MG TABS Take 30 mg by mouth daily.     No current facility-administered medications for this visit.    Allergies as of 11/04/2020 - Review Complete 11/02/2020  Allergen Reaction Noted  . Dilaudid [hydromorphone hcl] Nausea And Vomiting 03/19/2019    Review of Systems:    All systems reviewed and negative except where noted in HPI.  General Appearance:    Alert, cooperative, no distress, appears stated age  Head:    Normocephalic, without obvious abnormality, atraumatic  Eyes:    PERRL, conjunctiva/corneas clear,  Ears:    Grossly  normal hearing    Neurologic:  Grossly normal    Observations/Objective:  Labs: CMP     Component Value Date/Time   NA 137 07/21/2020 1031   K 4.8 07/21/2020 1031   CL 102 07/21/2020 1031   CO2 21 07/21/2020 1031   GLUCOSE 323 (H) 07/21/2020 1031   GLUCOSE 232 (H) 01/04/2020 1547   BUN 12 07/21/2020 1031   CREATININE 0.97 07/21/2020 1031   CALCIUM 9.1 07/21/2020 1031   PROT 6.8 07/21/2020 1031   ALBUMIN 3.6 (L) 07/21/2020 1031   AST 21 07/21/2020 1031   ALT 16 07/21/2020 1031   ALKPHOS 158 (H) 07/21/2020 1031   BILITOT 0.5 07/21/2020 1031   GFRNONAA 79 07/21/2020 1031   GFRAA 92 07/21/2020 1031   Lab Results  Component Value Date   WBC 3.3 (L) 07/21/2020   HGB 11.1 (L) 07/21/2020   HCT 35.8 (L) 07/21/2020   MCV 81 07/21/2020   PLT 130 (L) 07/21/2020    Imaging Studies: No results found.  Assessment and Plan:  Adam Duncan is a 70 y.o. 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 History of TIPS which has been working so far very well.  Episode of hepatic encephalopathy in April 2021.   Presently main issue is right-sided abdominal pain appears to be musculoskeletal as it is worse with movement.  Recent EGD showed two large polypoid lesions in the stomach.  Unclear if they are gastric paralysis versus polyps.  Plan 1.  Liver ultrasound to screen for [facility] which he is due in 01/2021 2.  Check labs to calculate meld score in 01/2021. 3. Continue Xifaxan.  Refill will be provided 4. Continue lactulose. 6.  EGD in 1 year to rule out any recurrence of gastric polyps 7.  Check hepatitis A and B immune status after he completes vaccination 8.  Office visit in a few weeks to treat his intermittent hemorrhoids which is symptomatic despite conservative management    I discussed the assessment and treatment plan with the patient. The patient was provided an opportunity to ask questions and all were answered.  The patient agreed with the plan and demonstrated an understanding of the instructions.   The patient was advised to call back or seek an in-person evaluation if the symptoms worsen or if the condition fails to improve as anticipated.  I provided 20 minutes of face-to-face time during this encounter.  Dr Adam Bellows MD,MRCP [facility]) Gastroenterology/Hepatology Pager: [phone]   Speech recognition software was used to dictate this note.

## 2020-11-04 NOTE — Progress Notes (Signed)
Order placed for an ultrasound of the RUQ to be done in May 2022.

## 2020-11-18 ENCOUNTER — Other Ambulatory Visit: Payer: Self-pay

## 2020-11-18 ENCOUNTER — Ambulatory Visit (INDEPENDENT_AMBULATORY_CARE_PROVIDER_SITE_OTHER): Payer: Medicare Other | Admitting: Dermatology

## 2020-11-18 ENCOUNTER — Encounter: Payer: Self-pay | Admitting: Dermatology

## 2020-11-18 DIAGNOSIS — C44612 Basal cell carcinoma of skin of right upper limb, including shoulder: Secondary | ICD-10-CM | POA: Diagnosis not present

## 2020-11-18 DIAGNOSIS — L578 Other skin changes due to chronic exposure to nonionizing radiation: Secondary | ICD-10-CM | POA: Diagnosis not present

## 2020-11-18 DIAGNOSIS — C44519 Basal cell carcinoma of skin of other part of trunk: Secondary | ICD-10-CM

## 2020-11-18 NOTE — Patient Instructions (Signed)

## 2020-11-18 NOTE — Progress Notes (Signed)
   Follow-Up Visit   Subjective  Adam Duncan is a 70 y.o. male who presents for the following: BCCs bx proven (R post deltoid, R lat back lat, pt presents for [facility] today).  The following portions of the chart were reviewed this encounter and updated as appropriate:   Tobacco  Allergies  Meds  Problems  Med Hx  Surg Hx  Fam Hx     Review of Systems:  No other skin or systemic complaints except as noted in HPI or Assessment and Plan.  Objective  Well appearing patient in no apparent distress; mood and affect are within normal limits.  A focused examination was performed including back. Relevant physical exam findings are noted in the Assessment and Plan.  Objective  Right lower back lat: Pink bx site  Objective  R post deltoid: Undetectable on exam   Assessment & Plan  Basal cell carcinoma (BCC) of skin of other part of torso Right lower back lat Destruction of lesion Complexity: extensive   Destruction method: electrodesiccation and curettage   Informed consent: discussed and consent obtained   Timeout:  patient name, date of birth, surgical site, and procedure verified Procedure prep:  Patient was prepped and draped in usual sterile fashion Prep type:  Isopropyl alcohol Anesthesia: the lesion was anesthetized in a standard fashion   Anesthetic:  1% lidocaine w/ epinephrine 1-100,000 buffered w/ 8.4% NaHCO3 Curettage performed in three different directions: Yes   Electrodesiccation performed over the curetted area: Yes   Final wound size (cm):  1.5 Hemostasis achieved with:  pressure, aluminum chloride and electrodesiccation Outcome: patient tolerated procedure well with no complications   Post-procedure details: sterile dressing applied and wound care instructions given   Dressing type: bandage and bacitracin     Basal cell carcinoma (BCC) of skin of right upper extremity including shoulder R post deltoid Undetectable, observe, recheck on f/u  Actinic  Damage - chronic, secondary to cumulative UV radiation exposure/sun exposure over time - diffuse scaly erythematous macules with underlying dyspigmentation - Recommend daily broad spectrum sunscreen SPF 30+ to sun-exposed areas, reapply every 2 hours as needed.  - Recommend staying in the shade or wearing long sleeves, sun glasses (UVA+UVB protection) and wide brim hats (4-inch brim around the entire circumference of the hat). - Call for new or changing lesions.  Return in about 5 months (around 04/20/2021) for TBSE, Hx of BCC, Hx of AKs.  I, Othelia Pulling, RMA, am acting as scribe for Sarina Ser, MD .  Documentation: I have reviewed the above documentation for accuracy and completeness, and I agree with the above.  Sarina Ser, MD

## 2020-11-22 ENCOUNTER — Encounter: Payer: Self-pay | Admitting: Dermatology

## 2020-11-25 ENCOUNTER — Other Ambulatory Visit: Payer: Self-pay

## 2020-11-25 ENCOUNTER — Ambulatory Visit (INDEPENDENT_AMBULATORY_CARE_PROVIDER_SITE_OTHER): Payer: Medicare Other | Admitting: Gastroenterology

## 2020-11-25 VITALS — BP 154/78 | HR 65 | Ht 67.0 in | Wt 293.0 lb

## 2020-11-25 DIAGNOSIS — K648 Other hemorrhoids: Secondary | ICD-10-CM | POA: Diagnosis not present

## 2020-11-25 NOTE — Progress Notes (Signed)
Patient follow-ups today for banding of hemorrhoids    Summary of history :   He is here to undergo banding of symptomatic hemorrhoids despite treatment conservatively.  Colonoscopy was performed in May 2021 that demonstrated large internal hemorrhoids.       Digital rectal exam performed in the presence of a chaperone.  CMA Jovon in the room External anal findings: Skin tags noted Internal findings: No abnormalities 3 columns of internal hemorrhoids seen prolapsing, No masses, no blood on glove noticed.    PROCEDURE NOTE: The patient presents with symptomatic grade 2 hemorrhoids, unresponsive to maximal medical therapy, requesting rubber band ligation of his/her hemorrhoidal disease.  All risks, benefits and alternative forms of therapy were described and informed consent was obtained.  In the Left Lateral Decubitus position (if anoscopy is performed) anoscopic examination revealed grade 2 hemorrhoids in the all position(s).   The decision was made to band the LL internal hemorrhoid, and the [place] was used to perform band ligation without complication.  Digital anorectal examination was then performed to assure proper positioning of the band, and to adjust the banded tissue as required.  The patient was discharged home without pain or other issues.  Dietary and behavioral recommendations were given and (if necessary - prescriptions were given), along with follow-up instructions.  The patient will return 4 weeks for follow-up and possible additional banding as required.  No complications were encountered and the patient tolerated the procedure well.   Plan:  1. Avoid constipation.  Commence on stool softeners if not already on  Follow-up: 4 weeks  Dr Jonathon Bellows MD,MRCP [facility]) Gastroenterology/Hepatology Pager: [phone]

## 2020-12-15 ENCOUNTER — Other Ambulatory Visit: Payer: Self-pay | Admitting: Family Medicine

## 2020-12-15 DIAGNOSIS — K219 Gastro-esophageal reflux disease without esophagitis: Secondary | ICD-10-CM

## 2020-12-23 ENCOUNTER — Other Ambulatory Visit: Payer: Self-pay

## 2020-12-23 ENCOUNTER — Encounter: Payer: Self-pay | Admitting: Surgery

## 2020-12-23 ENCOUNTER — Ambulatory Visit (INDEPENDENT_AMBULATORY_CARE_PROVIDER_SITE_OTHER): Payer: Medicare Other | Admitting: Gastroenterology

## 2020-12-23 ENCOUNTER — Telehealth: Payer: Self-pay | Admitting: Surgery

## 2020-12-23 ENCOUNTER — Ambulatory Visit (INDEPENDENT_AMBULATORY_CARE_PROVIDER_SITE_OTHER): Payer: Medicare Other | Admitting: Surgery

## 2020-12-23 ENCOUNTER — Encounter: Payer: Self-pay | Admitting: Gastroenterology

## 2020-12-23 VITALS — BP 150/60 | HR 64 | Ht 67.0 in | Wt 287.6 lb

## 2020-12-23 VITALS — BP 167/77 | HR 71 | Temp 98.2°F | Ht 68.0 in | Wt 285.8 lb

## 2020-12-23 DIAGNOSIS — K6289 Other specified diseases of anus and rectum: Secondary | ICD-10-CM | POA: Diagnosis not present

## 2020-12-23 DIAGNOSIS — K648 Other hemorrhoids: Secondary | ICD-10-CM | POA: Diagnosis not present

## 2020-12-23 NOTE — Patient Instructions (Signed)
Our surgery scheduler Marzetta Board will call you within 24-48 hours to get you scheduled. If you have not heard from her after 48 hours, please call our office. You will need to get Covid tested before surgery and have the blue sheet available when she calls to write down important information. If you have any concerns or questions, please feel free to call our office.     Surgical Procedures for Hemorrhoids, Care After This sheet gives you information about how to care for yourself after your procedure. Your health care provider may also give you more specific instructions. If you have problems or questions, contact your health care provider. What can I expect after the procedure? After the procedure, it is common to have:  Rectal pain.  Pain when you are having a bowel movement.  Slight rectal bleeding. This is more likely to happen with the first bowel movement after surgery. Follow these instructions at home: Medicines  Take over-the-counter and prescription medicines only as told by your health care provider.  If you were prescribed an antibiotic medicine, use it as told by your health care provider. Do not stop using the antibiotic even if your condition improves.  Ask your health care provider if the medicine prescribed to you requires you to avoid driving or using heavy machinery.  Use a stool softener or a bulk laxative as told by your health care provider. Eating and drinking  Follow instructions from your health care provider about what to eat or drink after your procedure.  You may need to take actions to prevent or treat constipation, such as: ? Drink enough fluid to keep your urine pale yellow. ? Take over-the-counter or prescription medicines. ? Eat foods that are high in fiber, such as beans, whole grains, and fresh fruits and vegetables. ? Limit foods that are high in fat and processed sugars, such as fried or sweet foods. Activity  Rest as told by your health care  provider.  Avoid sitting for a long time without moving. Get up to take short walks every 1-2 hours. This is important to improve blood flow and breathing. Ask for help if you feel weak or unsteady.  Return to your normal activities as told by your health care provider. Ask your health care provider what activities are safe for you.  Do not lift anything that is heavier than 10 lb (4.5 kg), or the limit that you are told, until your health care provider says that it is safe.  Do not strain to have a bowel movement.  Do not spend a long time sitting on the toilet.   General instructions  Take warm sitz baths for 15-20 minutes, 2-3 times a day to relieve soreness or itching and to keep the rectal area clean.  Apply ice packs to the area to reduce swelling and pain.  Do not drive for 24 hours if you were given a sedative during your procedure.  Keep all follow-up visits as told by your health care provider. This is important.   Contact a health care provider if:  Your pain medicine is not helping.  You have a fever or chills.  You have bad smelling drainage.  You have a lot of swelling.  You become constipated.  You have trouble passing urine. Get help right away if:  You have very bad rectal pain.  You have heavy bleeding from your rectum. Summary  After the procedure, it is common to have pain and slight rectal bleeding.  Take warm  sitz baths for 15-20 minutes, 2-3 times a day to relieve soreness or itching and to keep the rectal area clean.  Avoid straining when having a bowel movement.  Eat foods that are high in fiber, such as beans, whole grains, and fresh fruits and vegetables.  Take over-the-counter and prescription medicines only as told by your health care provider. This information is not intended to replace advice given to you by your health care provider. Make sure you discuss any questions you have with your health care provider. Document Revised:  02/19/2019 Document Reviewed: 07/23/2018 Elsevier Patient Education  2021 [employer].

## 2020-12-23 NOTE — Progress Notes (Signed)
Patient ID: Adam Duncan, male   DOB: March 21, 1951, 70 y.o.   MRN: [id number]  HPI Adam Duncan is a 70 y.o. male seen in consultation at the request of Dr. Vicente Males.  He has anorectal pain for the last several weeks.  He did have banding of hemorrhoids on 11/2020.  He was going to have a second banding by Dr. Vicente Males and he did fine large lesion that was very tender.  He reports that he has moderate to severe anorectal pain for the last several weeks worsening when he passed a bowel movement.  He does also have hematochezia.  No fevers or chills.  Of note he did have a history of prostate cancer and had a robotic prostatectomy.  He also had a ventral hernia repair with mesh several years ago.  I do not have any operative records available.  He also has significant cirrhosis of the liver but is well controlled.  There is no evidence of ascites.  He did have a CT scan that I personally reviewed showing cirrhosis no ascites no ventral hernias.  No other acute interval abnormalities.  Last INR and CMP were normal.  Last INR was normal as well. He does take his spironolactone and lactulose and he is being followed by Dr. Vicente Males for cirrhosis as well. 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 He denies any angina or any shortness of breath.  He is able to perform 4 METS of activity without any significant symptoms.  HPI  Past Medical History:  Diagnosis Date  . Acute confusion 01/04/2020  . Allergy   . Basal cell carcinoma 05/10/2020   R post deltoid, clear with bx  . Basal cell carcinoma 05/10/2020   R lower back lat, [facility] 11/19/19  . Cancer of prostate ([place])   . 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 Diabetes mellitus without complication ([place])   . GERD (gastroesophageal reflux disease)   . Gout   . Hypertension   . Insomnia   . Skin cancer     Past Surgical History:  Procedure Laterality Date  . BIOPSY  09/20/2020   Procedure: BIOPSY;  Surgeon: Rush Landmark Telford Nab., MD;  Location: [place];  Service: Gastroenterology;;  . CATARACT EXTRACTION    . COLONOSCOPY WITH PROPOFOL N/A 01/19/2020   Procedure: COLONOSCOPY WITH PROPOFOL;  Surgeon: Jonathon Bellows, MD;  Location: [facility] ENDOSCOPY;  Service: Gastroenterology;  Laterality: N/A;  . ENDOSCOPIC MUCOSAL RESECTION N/A 09/20/2020   Procedure: ENDOSCOPIC MUCOSAL RESECTION;  Surgeon: Rush Landmark Telford Nab., MD;  Location: [place];  Service: Gastroenterology;  Laterality: N/A;  . ESOPHAGOGASTRODUODENOSCOPY (EGD) WITH PROPOFOL N/A 01/19/2020   Procedure: ESOPHAGOGASTRODUODENOSCOPY (EGD) WITH PROPOFOL;  Surgeon: Jonathon Bellows, MD;  Location: [facility] ENDOSCOPY;  Service: Gastroenterology;  Laterality: N/A;  . ESOPHAGOGASTRODUODENOSCOPY (EGD) WITH PROPOFOL N/A 09/20/2020   Procedure: ESOPHAGOGASTRODUODENOSCOPY (EGD) WITH PROPOFOL;  Surgeon: Rush Landmark Telford Nab., MD;  Location: [place];  Service: Gastroenterology;  Laterality: N/A;  . EUS N/A 09/20/2020   Procedure: UPPER ENDOSCOPIC ULTRASOUND (EUS) RADIAL;  Surgeon: Rush Landmark Telford Nab., MD;  Location: [place];  Service: Gastroenterology;  Laterality: N/A;  . HEMOSTASIS CLIP PLACEMENT  09/20/2020   Procedure: HEMOSTASIS CLIP PLACEMENT;  Surgeon: Irving Copas., MD;  Location: [place];  Service: Gastroenterology;;  . HEMOSTASIS CONTROL  09/20/2020   Procedure: HEMOSTASIS CONTROL;  Surgeon: Irving Copas., MD;  Location: [place];  Service: Gastroenterology;;  . HERNIA REPAIR  2015   x 2 and mesh was  put in   . NASAL SEPTUM SURGERY    . PROSTATE SURGERY    . TIPS PROCEDURE    . TONSILLECTOMY AND ADENOIDECTOMY      Family History  Problem Relation Age of Onset  . Lymphoma Mother   . Lung cancer Father   . Rheum arthritis Sister   . Irritable bowel syndrome Sister   . COPD Brother   . Cancer Brother        unsure of type  . Cancer Maternal Grandfather        unsure of type  . Cancer Paternal Grandfather        unsure of type  . Arthritis Sister      Social History Social History   Tobacco Use  . Smoking status: Never Smoker  . Smokeless tobacco: Never Used  Vaping Use  . Vaping Use: Never used  Substance Use Topics  . Alcohol use: Not Currently  . Drug use: Never    Allergies  Allergen Reactions  . Dilaudid [Hydromorphone Hcl] Nausea And Vomiting    Current Outpatient Medications  Medication Sig Dispense Refill  . allopurinol (ZYLOPRIM) 100 MG tablet TAKE 1 TABLET(100 MG) BY MOUTH DAILY (Patient taking differently: Take 100 mg by mouth daily.) 90 tablet 3  . ascorbic acid (VITAMIN C) 500 MG tablet Take 500 mg by mouth daily.    . Cholecalciferol (VITAMIN D3) 50 MCG (2000 UT) capsule Take 2,000 Units by mouth daily.    . Continuous Blood Gluc Sensor (FREESTYLE LIBRE 14 DAY SENSOR) MISC APPLY EVERY 14 DAYS AS DIRECTED 6 each 3  . Dulaglutide (TRULICITY) 3 KV/4.2VZ SOPN Inject 3 mg as directed once a week. (Patient taking differently: Inject 3 mg into the skin every Monday.) 6 mL 3  . fluticasone (FLONASE) 50 MCG/ACT nasal spray Place 1 spray into both nostrils daily.    Marland Kitchen glipiZIDE (GLUCOTROL XL) 10 MG 24 hr tablet Take 1 tablet (10 mg total) by mouth daily with breakfast. 90 tablet 3  . hepatitis A-hepatitis B (TWINRIX) 720-20 ELU-MCG/ML injection First dose: Inject 1 ml into muscle, Second dose: Inject 4 weeks after initial dose, Third dose: Inject 6 months after initial dose. 1 mL 2  . lactulose (CHRONULAC) 10 GM/15ML solution TAKE 40 MILLILITERS BY MOUTH TWICE A DAY (Patient taking differently: Take by mouth 2 (two) times daily.) 7200 mL 3  . metFORMIN (GLUCOPHAGE) 1000 MG tablet TAKE 1 TABLET(1000 MG) BY MOUTH TWICE DAILY WITH A MEAL 180 tablet 1  . methocarbamol (ROBAXIN) 500 MG tablet TAKE 1 TABLET BY MOUTH THREE TIMES DAILY AS NEEDED FOR MUSCLE SPASMS 90 tablet 0  . montelukast (SINGULAIR) 10 MG tablet Take 1 tablet (10 mg total) by mouth at bedtime. 90 tablet 3  . pantoprazole (PROTONIX) 40 MG tablet Take 1 tablet  (40 mg total) by mouth 2 (two) times daily. 60 tablet 2  . rifaximin (XIFAXAN) 550 MG TABS tablet Take 1 tablet (550 mg total) by mouth 2 (two) times daily. 60 tablet 5  . spironolactone (ALDACTONE) 100 MG tablet TAKE 1 TABLET(100 MG) BY MOUTH DAILY (Patient taking differently: Take 100 mg by mouth daily.) 90 tablet 3  . VITAMIN A PO Take 1 tablet by mouth daily.    . vitamin B-12 (CYANOCOBALAMIN) 1000 MCG tablet Take 1,000 mcg by mouth daily.    . Zinc 30 MG TABS Take 30 mg by mouth daily.     No current facility-administered medications for this visit.     Review  of Systems Full ROS  was asked and was negative except for the information on the HPI  Physical Exam Blood pressure (!) 167/77, pulse 71, temperature 98.2 F (36.8 C), temperature source Oral, height 5' 8"  (1.727 m), weight 285 lb 12.8 oz (129.6 kg), SpO2 96 %. CONSTITUTIONAL: NAD BMI 43 EYES: Pupils are equal, round, , Sclera are non-icteric. EARS, NOSE, MOUTH AND THROAT: He is wearing a mask Hearing is intact to voice. LYMPH NODES:  Lymph nodes in the neck are normal. RESPIRATORY:  Lungs are clear. There is normal respiratory effort, with equal breath sounds bilaterally, and without pathologic use of accessory muscles. CARDIOVASCULAR: Heart is regular without murmurs, gallops, or rubs. GI: The abdomen is  soft, nontender, and nondistended. There are no palpable masses. There is no hepatosplenomegaly. There are normal bowel sounds in all quadrants. No hernias  RECTAL: Left posterolateral position Evidence of 3 cm polypoid lesion involving anal canal and distal rectum. Irritated. Polyp vs hemorrhoid vs condyloma. He does have small skin tags l;ocated anterolateral position and internal hemorrhoids grade III MUSCULOSKELETAL: Normal muscle strength and tone. No cyanosis or edema.   SKIN: Turgor is good and there are no pathologic skin lesions or ulcers. NEUROLOGIC: Motor and sensation is grossly normal. Cranial nerves are grossly  intact. PSYCH:  Oriented to person, place and time. Affect is normal.  Data Reviewed  I have personally reviewed the patient's imaging, laboratory findings and medical records.    Assessment/Plan  70 year old male normally anorectal lesion that is significant symptomatic.  He does have significant comorbidities to include cirrhosis multiple abdominal operations and prior banding of the hemorrhoids.  Causes hematochezia and pain.  Differential diagnosis will include: Versus hemorrhoid versus condyloma.  I do think the next step will be to perform an examination under anesthesia and excision of anorectal lesion.  Procedure discussed with the patient in detail.  Risks, benefits and possible applications including but not limited to: Bleeding, infection, chronic pain, recurrence and abrasion of the lesion.  Extensive counseling provided  Caroleen Hamman, MD FACS General Surgeon 12/23/2020, 3:07 PM

## 2020-12-23 NOTE — H&P (View-Only) (Signed)
Patient ID: Adam Duncan, male   DOB: 1950/12/07, 70 y.o.   MRN: [id number]  HPI Adam Duncan is a 70 y.o. male seen in consultation at the request of Dr. Vicente Males.  He has anorectal pain for the last several weeks.  He did have banding of hemorrhoids on 11/2020.  He was going to have a second banding by Dr. Vicente Males and he did fine large lesion that was very tender.  He reports that he has moderate to severe anorectal pain for the last several weeks worsening when he passed a bowel movement.  He does also have hematochezia.  No fevers or chills.  Of note he did have a history of prostate cancer and had a robotic prostatectomy.  He also had a ventral hernia repair with mesh several years ago.  I do not have any operative records available.  He also has significant cirrhosis of the liver but is well controlled.  There is no evidence of ascites.  He did have a CT scan that I personally reviewed showing cirrhosis no ascites no ventral hernias.  No other acute interval abnormalities.  Last INR and CMP were normal.  Last INR was normal as well. He does take his spironolactone and lactulose and he is being followed by Dr. Vicente Males for cirrhosis as well. 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 He denies any angina or any shortness of breath.  He is able to perform 4 METS of activity without any significant symptoms.  HPI  Past Medical History:  Diagnosis Date  . Acute confusion 01/04/2020  . Allergy   . Basal cell carcinoma 05/10/2020   R post deltoid, clear with bx  . Basal cell carcinoma 05/10/2020   R lower back lat, [facility] 11/19/19  . Cancer of prostate ([place])   . 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 Diabetes mellitus without complication ([place])   . GERD (gastroesophageal reflux disease)   . Gout   . Hypertension   . Insomnia   . Skin cancer     Past Surgical History:  Procedure Laterality Date  . BIOPSY  09/20/2020   Procedure: BIOPSY;  Surgeon: Rush Landmark Telford Nab., MD;  Location: [place];  Service: Gastroenterology;;  . CATARACT EXTRACTION    . COLONOSCOPY WITH PROPOFOL N/A 01/19/2020   Procedure: COLONOSCOPY WITH PROPOFOL;  Surgeon: Jonathon Bellows, MD;  Location: [facility] ENDOSCOPY;  Service: Gastroenterology;  Laterality: N/A;  . ENDOSCOPIC MUCOSAL RESECTION N/A 09/20/2020   Procedure: ENDOSCOPIC MUCOSAL RESECTION;  Surgeon: Rush Landmark Telford Nab., MD;  Location: [place];  Service: Gastroenterology;  Laterality: N/A;  . ESOPHAGOGASTRODUODENOSCOPY (EGD) WITH PROPOFOL N/A 01/19/2020   Procedure: ESOPHAGOGASTRODUODENOSCOPY (EGD) WITH PROPOFOL;  Surgeon: Jonathon Bellows, MD;  Location: [facility] ENDOSCOPY;  Service: Gastroenterology;  Laterality: N/A;  . ESOPHAGOGASTRODUODENOSCOPY (EGD) WITH PROPOFOL N/A 09/20/2020   Procedure: ESOPHAGOGASTRODUODENOSCOPY (EGD) WITH PROPOFOL;  Surgeon: Rush Landmark Telford Nab., MD;  Location: [place];  Service: Gastroenterology;  Laterality: N/A;  . EUS N/A 09/20/2020   Procedure: UPPER ENDOSCOPIC ULTRASOUND (EUS) RADIAL;  Surgeon: Rush Landmark Telford Nab., MD;  Location: [place];  Service: Gastroenterology;  Laterality: N/A;  . HEMOSTASIS CLIP PLACEMENT  09/20/2020   Procedure: HEMOSTASIS CLIP PLACEMENT;  Surgeon: Irving Copas., MD;  Location: [place];  Service: Gastroenterology;;  . HEMOSTASIS CONTROL  09/20/2020   Procedure: HEMOSTASIS CONTROL;  Surgeon: Irving Copas., MD;  Location: [place];  Service: Gastroenterology;;  . HERNIA REPAIR  2015   x 2 and mesh was  put in   . NASAL SEPTUM SURGERY    . PROSTATE SURGERY    . TIPS PROCEDURE    . TONSILLECTOMY AND ADENOIDECTOMY      Family History  Problem Relation Age of Onset  . Lymphoma Mother   . Lung cancer Father   . Rheum arthritis Sister   . Irritable bowel syndrome Sister   . COPD Brother   . Cancer Brother        unsure of type  . Cancer Maternal Grandfather        unsure of type  . Cancer Paternal Grandfather        unsure of type  . Arthritis Sister      Social History Social History   Tobacco Use  . Smoking status: Never Smoker  . Smokeless tobacco: Never Used  Vaping Use  . Vaping Use: Never used  Substance Use Topics  . Alcohol use: Not Currently  . Drug use: Never    Allergies  Allergen Reactions  . Dilaudid [Hydromorphone Hcl] Nausea And Vomiting    Current Outpatient Medications  Medication Sig Dispense Refill  . allopurinol (ZYLOPRIM) 100 MG tablet TAKE 1 TABLET(100 MG) BY MOUTH DAILY (Patient taking differently: Take 100 mg by mouth daily.) 90 tablet 3  . ascorbic acid (VITAMIN C) 500 MG tablet Take 500 mg by mouth daily.    . Cholecalciferol (VITAMIN D3) 50 MCG (2000 UT) capsule Take 2,000 Units by mouth daily.    . Continuous Blood Gluc Sensor (FREESTYLE LIBRE 14 DAY SENSOR) MISC APPLY EVERY 14 DAYS AS DIRECTED 6 each 3  . Dulaglutide (TRULICITY) 3 LS/9.3TD SOPN Inject 3 mg as directed once a week. (Patient taking differently: Inject 3 mg into the skin every Monday.) 6 mL 3  . fluticasone (FLONASE) 50 MCG/ACT nasal spray Place 1 spray into both nostrils daily.    Marland Kitchen glipiZIDE (GLUCOTROL XL) 10 MG 24 hr tablet Take 1 tablet (10 mg total) by mouth daily with breakfast. 90 tablet 3  . hepatitis A-hepatitis B (TWINRIX) 720-20 ELU-MCG/ML injection First dose: Inject 1 ml into muscle, Second dose: Inject 4 weeks after initial dose, Third dose: Inject 6 months after initial dose. 1 mL 2  . lactulose (CHRONULAC) 10 GM/15ML solution TAKE 40 MILLILITERS BY MOUTH TWICE A DAY (Patient taking differently: Take by mouth 2 (two) times daily.) 7200 mL 3  . metFORMIN (GLUCOPHAGE) 1000 MG tablet TAKE 1 TABLET(1000 MG) BY MOUTH TWICE DAILY WITH A MEAL 180 tablet 1  . methocarbamol (ROBAXIN) 500 MG tablet TAKE 1 TABLET BY MOUTH THREE TIMES DAILY AS NEEDED FOR MUSCLE SPASMS 90 tablet 0  . montelukast (SINGULAIR) 10 MG tablet Take 1 tablet (10 mg total) by mouth at bedtime. 90 tablet 3  . pantoprazole (PROTONIX) 40 MG tablet Take 1 tablet  (40 mg total) by mouth 2 (two) times daily. 60 tablet 2  . rifaximin (XIFAXAN) 550 MG TABS tablet Take 1 tablet (550 mg total) by mouth 2 (two) times daily. 60 tablet 5  . spironolactone (ALDACTONE) 100 MG tablet TAKE 1 TABLET(100 MG) BY MOUTH DAILY (Patient taking differently: Take 100 mg by mouth daily.) 90 tablet 3  . VITAMIN A PO Take 1 tablet by mouth daily.    . vitamin B-12 (CYANOCOBALAMIN) 1000 MCG tablet Take 1,000 mcg by mouth daily.    . Zinc 30 MG TABS Take 30 mg by mouth daily.     No current facility-administered medications for this visit.     Review  of Systems Full ROS  was asked and was negative except for the information on the HPI  Physical Exam Blood pressure (!) 167/77, pulse 71, temperature 98.2 F (36.8 C), temperature source Oral, height 5' 8"  (1.727 m), weight 285 lb 12.8 oz (129.6 kg), SpO2 96 %. CONSTITUTIONAL: NAD BMI 43 EYES: Pupils are equal, round, , Sclera are non-icteric. EARS, NOSE, MOUTH AND THROAT: He is wearing a mask Hearing is intact to voice. LYMPH NODES:  Lymph nodes in the neck are normal. RESPIRATORY:  Lungs are clear. There is normal respiratory effort, with equal breath sounds bilaterally, and without pathologic use of accessory muscles. CARDIOVASCULAR: Heart is regular without murmurs, gallops, or rubs. GI: The abdomen is  soft, nontender, and nondistended. There are no palpable masses. There is no hepatosplenomegaly. There are normal bowel sounds in all quadrants. No hernias  RECTAL: Left posterolateral position Evidence of 3 cm polypoid lesion involving anal canal and distal rectum. Irritated. Polyp vs hemorrhoid vs condyloma. He does have small skin tags l;ocated anterolateral position and internal hemorrhoids grade III MUSCULOSKELETAL: Normal muscle strength and tone. No cyanosis or edema.   SKIN: Turgor is good and there are no pathologic skin lesions or ulcers. NEUROLOGIC: Motor and sensation is grossly normal. Cranial nerves are grossly  intact. PSYCH:  Oriented to person, place and time. Affect is normal.  Data Reviewed  I have personally reviewed the patient's imaging, laboratory findings and medical records.    Assessment/Plan  70 year old male normally anorectal lesion that is significant symptomatic.  He does have significant comorbidities to include cirrhosis multiple abdominal operations and prior banding of the hemorrhoids.  Causes hematochezia and pain.  Differential diagnosis will include: Versus hemorrhoid versus condyloma.  I do think the next step will be to perform an examination under anesthesia and excision of anorectal lesion.  Procedure discussed with the patient in detail.  Risks, benefits and possible applications including but not limited to: Bleeding, infection, chronic pain, recurrence and abrasion of the lesion.  Extensive counseling provided  Caroleen Hamman, MD FACS General Surgeon 12/23/2020, 3:07 PM

## 2020-12-23 NOTE — Progress Notes (Signed)
Patient follow-ups today for banding of hemorrhoids    Summary of history :  Colonoscopy was performed in May 2021 that demonstrated large internal hemorrhoids.He is here to undergo banding of symptomatic hemorrhoids despite treatment conservatively.  First round:11/25/2020: LL column banded     Interval history   11/25/2020-12/23/2020  Since his last visit he says that he has had pain in his perianal area.  He states that he has had this pain previously as well but is slightly worse after the banding.  Digital rectal exam performed in the presence of a chaperone. External anal findings: There is a skin tag at the 3 o'clock position and a large polypoid lesion in the 6 o'clock position slightly erythematous and tender on palpation.  He was uncomfortable and attempted to perform a rectal examination.  Did not proceed further    Impression: The patient is here for symptomatic grade 2 hemorrhoids banding.  I banded the left lateral column 4 weeks back and he was here for repeat banding and on examination noted either a thrombosed hemorrhoid versus third-degree strangulated hemorrhoid I am unsure.  I have discussed with Dr. Dahlia Byes in surgery who has been kind enough to see this patient today to evaluate further , if it requires incision and drainage  Plan:  1. Follow-up with Dr. Dahlia Byes today and I will see him back in 6 to 8 weeks to decide on next steps.  If undergoes surgical hemorrhoidectomy he will not require any further intervention from my end  Follow-up: 4 weeks  Dr Jonathon Bellows MD,MRCP [facility]) Gastroenterology/Hepatology Pager: [phone]

## 2020-12-23 NOTE — Telephone Encounter (Signed)
Outbound call made to pt; LVM req'n call back to review/discuss the following Pre-Admission date/time, COVID Testing date and Surgery date info.  Surgery Date: 12/28/20 Preadmission Testing Date: 12/27/20 (phone 8a-1p) Covid Testing Date: 12/27/20 @ 1:10 pm - patient advised to go to the [place] ([place])  Also, plz remind the pt to call [phone], between 1-3:00pm the day before surgery, to find out what time to arrive for surgery.

## 2020-12-24 NOTE — Telephone Encounter (Signed)
Pt has been notified of all surgical info & prep; acknowledged understanding & thanked.

## 2020-12-27 ENCOUNTER — Other Ambulatory Visit
Admission: RE | Admit: 2020-12-27 | Discharge: 2020-12-27 | Disposition: A | Discharge status: Home / Self Care | Payer: Medicare Other | Source: Ambulatory Visit | Attending: Surgery | Admitting: Surgery

## 2020-12-27 ENCOUNTER — Other Ambulatory Visit: Payer: Self-pay

## 2020-12-27 ENCOUNTER — Encounter
Admission: RE | Admit: 2020-12-27 | Discharge: 2020-12-27 | Disposition: A | Discharge status: Home / Self Care | Payer: Medicare Other | Source: Ambulatory Visit | Attending: Surgery | Admitting: Surgery

## 2020-12-27 DIAGNOSIS — Z7984 Long term (current) use of oral hypoglycemic drugs: Secondary | ICD-10-CM | POA: Insufficient documentation

## 2020-12-27 DIAGNOSIS — Z01818 Encounter for other preprocedural examination: Secondary | ICD-10-CM | POA: Diagnosis not present

## 2020-12-27 DIAGNOSIS — Z20822 Contact with and (suspected) exposure to covid-19: Secondary | ICD-10-CM | POA: Diagnosis not present

## 2020-12-27 DIAGNOSIS — E118 Type 2 diabetes mellitus with unspecified complications: Secondary | ICD-10-CM | POA: Diagnosis not present

## 2020-12-27 DIAGNOSIS — I10 Essential (primary) hypertension: Secondary | ICD-10-CM | POA: Diagnosis not present

## 2020-12-27 LAB — COMPREHENSIVE METABOLIC PANEL
ALT: 23 U/L (ref 0–44)
AST: 37 U/L (ref 15–41)
Albumin: 3.2 g/dL — ABNORMAL LOW (ref 3.5–5.0)
Alkaline Phosphatase: 115 U/L (ref 38–126)
Anion gap: 11 (ref 5–15)
BUN: 12 mg/dL (ref 8–23)
CO2: 20 mmol/L — ABNORMAL LOW (ref 22–32)
Calcium: 8.9 mg/dL (ref 8.9–10.3)
Chloride: 105 mmol/L (ref 98–111)
Creatinine, Ser: 0.89 mg/dL (ref 0.61–1.24)
GFR, Estimated: >60 mL/min (ref >60)
Glucose, Bld: 345 mg/dL — ABNORMAL HIGH (ref 70–99)
Potassium: 4.2 mmol/L (ref 3.5–5.1)
Sodium: 136 mmol/L (ref 135–145)
Total Bilirubin: 1.7 mg/dL — ABNORMAL HIGH (ref 0.3–1.2)
Total Protein: 6.3 g/dL — ABNORMAL LOW (ref 6.5–8.1)

## 2020-12-27 LAB — PROTIME-INR
INR: 1.3 — ABNORMAL HIGH (ref 0.8–1.2)
Prothrombin Time: 15.3 seconds — ABNORMAL HIGH (ref 11.4–15.2)

## 2020-12-27 LAB — CBC
HCT: 40.2 % (ref 39.0–52.0)
Hemoglobin: 13.7 g/dL (ref 13.0–17.0)
MCH: 31.9 pg (ref 26.0–34.0)
MCHC: 34.1 g/dL (ref 30.0–36.0)
MCV: 93.5 fL (ref 80.0–100.0)
Platelets: 109 10*3/uL — ABNORMAL LOW (ref 150–400)
RBC: 4.3 MIL/uL (ref 4.22–5.81)
RDW: 14.6 % (ref 11.5–15.5)
WBC: 3.8 10*3/uL — ABNORMAL LOW (ref 4.0–10.5)
nRBC: 0 % (ref 0.0–0.2)

## 2020-12-27 LAB — APTT: aPTT: 32 seconds (ref 24–36)

## 2020-12-27 NOTE — Progress Notes (Signed)
  Perioperative Services: Pre-Admission/Anesthesia Testing  Abnormal Lab Notification    Date: 12/27/20  Name: Adam Duncan MRN:   [id number]  Re: Abnormal labs noted during PAT appointment   Provider(s) Notified: Jules Husbands, MD Notification mode: Routed and/or faxed via [facility]   ABNORMAL LAB VALUE(S): Lab Results  Component Value Date   GLUCOSE 345 (H) 12/27/2020    Notes: Patient with a T2DM diagnosis. He is currently on both oral (glipizide, metformin) and parenteral (dulaglutide) therapies. Last Hgb A1c was 9.6% on 11/02/2020. This communication is being sent in order to determine if patient is deemed to have adequate preoperative glycemic control in efforts to reduce his risk of developing SSI.  With that being said, the benefit of improving glycemic control must be weighed against the overall risk associated with delaying a necessary elective orthopedic surgery for this patient.   This is a Community education officer; no formal response is required.  Honor Loh, MSN, APRN, FNP-C, CEN [facility]  Peri-operative Services Nurse Practitioner Phone: [phone] 12/27/20 3:28 PM

## 2020-12-27 NOTE — Pre-Procedure Instructions (Signed)
Pt was called multiple times today with messages being left about needing to complete phone interivew. I also called pts wife and left her a message with neither the pt nor wife calling back. Notified Dennie Maizes at Dr Barb Merino office about pt not calling back. She also reached out to pt and his wife and had to leave messages on both #'s.

## 2020-12-28 ENCOUNTER — Ambulatory Visit: Payer: Medicare Other | Admitting: Anesthesiology

## 2020-12-28 ENCOUNTER — Encounter
Admission: RE | Disposition: A | Discharge status: Home / Self Care | Payer: Self-pay | Source: Home / Self Care | Attending: Surgery

## 2020-12-28 ENCOUNTER — Encounter: Payer: Self-pay | Admitting: Surgery

## 2020-12-28 ENCOUNTER — Ambulatory Visit
Admission: RE | Admit: 2020-12-28 | Discharge: 2020-12-28 | Disposition: A | Discharge status: Home / Self Care | Payer: Medicare Other | Attending: Surgery | Admitting: Surgery

## 2020-12-28 DIAGNOSIS — D013 Carcinoma in situ of anus and anal canal: Secondary | ICD-10-CM

## 2020-12-28 DIAGNOSIS — Z7984 Long term (current) use of oral hypoglycemic drugs: Secondary | ICD-10-CM | POA: Diagnosis not present

## 2020-12-28 DIAGNOSIS — Z79899 Other long term (current) drug therapy: Secondary | ICD-10-CM | POA: Insufficient documentation

## 2020-12-28 DIAGNOSIS — K921 Melena: Secondary | ICD-10-CM | POA: Insufficient documentation

## 2020-12-28 DIAGNOSIS — Z8546 Personal history of malignant neoplasm of prostate: Secondary | ICD-10-CM | POA: Diagnosis not present

## 2020-12-28 DIAGNOSIS — D012 Carcinoma in situ of rectum: Secondary | ICD-10-CM | POA: Diagnosis not present

## 2020-12-28 DIAGNOSIS — D490 Neoplasm of unspecified behavior of digestive system: Secondary | ICD-10-CM | POA: Diagnosis present

## 2020-12-28 DIAGNOSIS — K7460 Unspecified cirrhosis of liver: Secondary | ICD-10-CM | POA: Insufficient documentation

## 2020-12-28 DIAGNOSIS — Z885 Allergy status to narcotic agent status: Secondary | ICD-10-CM | POA: Diagnosis not present

## 2020-12-28 DIAGNOSIS — K6289 Other specified diseases of anus and rectum: Secondary | ICD-10-CM

## 2020-12-28 HISTORY — PX: EVALUATION UNDER ANESTHESIA WITH HEMORRHOIDECTOMY: SHX5624

## 2020-12-28 LAB — GLUCOSE, CAPILLARY
Glucose-Capillary: 230 mg/dL — ABNORMAL HIGH (ref 70–99)
Glucose-Capillary: 215 mg/dL — ABNORMAL HIGH (ref 70–99)

## 2020-12-28 LAB — SARS CORONAVIRUS 2 (TAT 6-24 HRS): SARS Coronavirus 2: NEGATIVE

## 2020-12-28 SURGERY — EXAM UNDER ANESTHESIA WITH HEMORRHOIDECTOMY
Anesthesia: General | Site: Rectum

## 2020-12-28 MED ORDER — CHLORHEXIDINE GLUCONATE CLOTH 2 % EX PADS
6.0000 | MEDICATED_PAD | Freq: Once | CUTANEOUS | Status: AC
  Administered 2020-12-28: 6 via TOPICAL

## 2020-12-28 MED ORDER — GABAPENTIN 300 MG PO CAPS
ORAL_CAPSULE | ORAL | Status: AC
  Administered 2020-12-28: 300 mg via ORAL
  Filled 2020-12-28: qty 1

## 2020-12-28 MED ORDER — SODIUM CHLORIDE 0.9 % IV SOLN: INTRAVENOUS | Status: DC

## 2020-12-28 MED ORDER — OXYCODONE HCL 5 MG/5ML PO SOLN: 5.0000 mg | Freq: Once | ORAL | Status: DC | PRN

## 2020-12-28 MED ORDER — ONDANSETRON HCL 4 MG/2ML IJ SOLN: 4.0000 mg | Freq: Once | INTRAMUSCULAR | Status: DC | PRN

## 2020-12-28 MED ORDER — GABAPENTIN 300 MG PO CAPS
300.0000 mg | ORAL_CAPSULE | ORAL | Status: AC
Start: 2020-12-28 — End: 2020-12-28

## 2020-12-28 MED ORDER — OXYCODONE HCL 5 MG PO TABS: 5.0000 mg | ORAL_TABLET | Freq: Once | ORAL | Status: DC | PRN

## 2020-12-28 MED ORDER — ORAL CARE MOUTH RINSE: 15.0000 mL | Freq: Once | OROMUCOSAL | Status: AC

## 2020-12-28 MED ORDER — ROCURONIUM BROMIDE 100 MG/10ML IV SOLN
INTRAVENOUS | Status: DC | PRN
  Administered 2020-12-28: 50 mg via INTRAVENOUS
  Administered 2020-12-28: 10 mg via INTRAVENOUS

## 2020-12-28 MED ORDER — CELECOXIB 200 MG PO CAPS
200.0000 mg | ORAL_CAPSULE | ORAL | Status: AC
Start: 2020-12-28 — End: 2020-12-28

## 2020-12-28 MED ORDER — SODIUM CHLORIDE 0.9 % IV SOLN
INTRAVENOUS | Status: AC
  Filled 2020-12-28: qty 2

## 2020-12-28 MED ORDER — CHLORHEXIDINE GLUCONATE 0.12 % MT SOLN
OROMUCOSAL | Status: AC
  Filled 2020-12-28: qty 15

## 2020-12-28 MED ORDER — MIDAZOLAM HCL 2 MG/2ML IJ SOLN
INTRAMUSCULAR | Status: AC
  Filled 2020-12-28: qty 2

## 2020-12-28 MED ORDER — ONDANSETRON HCL 4 MG/2ML IJ SOLN
INTRAMUSCULAR | Status: DC | PRN
  Administered 2020-12-28: 4 mg via INTRAVENOUS

## 2020-12-28 MED ORDER — BUPIVACAINE LIPOSOME 1.3 % IJ SUSP
INTRAMUSCULAR | Status: DC | PRN
  Administered 2020-12-28: 20 mL

## 2020-12-28 MED ORDER — HYDROCODONE-ACETAMINOPHEN 5-325 MG PO TABS: 1.0000 | ORAL_TABLET | ORAL | 0 refills | Status: DC | PRN

## 2020-12-28 MED ORDER — SODIUM CHLORIDE 0.9 % IV SOLN
2.0000 g | INTRAVENOUS | Status: AC
Start: 2020-12-28 — End: 2020-12-28
  Administered 2020-12-28: 2 g via INTRAVENOUS

## 2020-12-28 MED ORDER — FENTANYL CITRATE (PF) 100 MCG/2ML IJ SOLN: 25.0000 ug | INTRAMUSCULAR | Status: DC | PRN

## 2020-12-28 MED ORDER — LIDOCAINE HCL (CARDIAC) PF 100 MG/5ML IV SOSY
PREFILLED_SYRINGE | INTRAVENOUS | Status: DC | PRN
  Administered 2020-12-28: 100 mg via INTRAVENOUS

## 2020-12-28 MED ORDER — MIDAZOLAM HCL 2 MG/2ML IJ SOLN
INTRAMUSCULAR | Status: DC | PRN
  Administered 2020-12-28: 2 mg via INTRAVENOUS

## 2020-12-28 MED ORDER — CELECOXIB 200 MG PO CAPS
ORAL_CAPSULE | ORAL | Status: AC
  Administered 2020-12-28: 200 mg via ORAL
  Filled 2020-12-28: qty 2

## 2020-12-28 MED ORDER — CHLORHEXIDINE GLUCONATE 0.12 % MT SOLN
15.0000 mL | Freq: Once | OROMUCOSAL | Status: AC
  Administered 2020-12-28: 15 mL via OROMUCOSAL

## 2020-12-28 MED ORDER — PENTAFLUOROPROP-TETRAFLUOROETH EX AERO
INHALATION_SPRAY | CUTANEOUS | Status: AC
  Filled 2020-12-28: qty 30

## 2020-12-28 MED ORDER — FENTANYL CITRATE (PF) 100 MCG/2ML IJ SOLN
INTRAMUSCULAR | Status: AC
  Filled 2020-12-28: qty 2

## 2020-12-28 MED ORDER — FENTANYL CITRATE (PF) 100 MCG/2ML IJ SOLN
INTRAMUSCULAR | Status: DC | PRN
  Administered 2020-12-28 (×2): 50 ug via INTRAVENOUS

## 2020-12-28 MED ORDER — CHLORHEXIDINE GLUCONATE CLOTH 2 % EX PADS
6.0000 | MEDICATED_PAD | Freq: Once | CUTANEOUS | Status: AC
Start: 2020-12-28 — End: 2020-12-28
  Administered 2020-12-28: 6 via TOPICAL

## 2020-12-28 MED ORDER — ACETAMINOPHEN 10 MG/ML IV SOLN: 1000.0000 mg | Freq: Once | INTRAVENOUS | Status: DC | PRN

## 2020-12-28 MED ORDER — PROPOFOL 10 MG/ML IV BOLUS
INTRAVENOUS | Status: DC | PRN
  Administered 2020-12-28: 120 mg via INTRAVENOUS

## 2020-12-28 MED ORDER — SUGAMMADEX SODIUM 200 MG/2ML IV SOLN
INTRAVENOUS | Status: DC | PRN
  Administered 2020-12-28: 400 mg via INTRAVENOUS

## 2020-12-28 MED ORDER — BUPIVACAINE LIPOSOME 1.3 % IJ SUSP
INTRAMUSCULAR | Status: AC
  Filled 2020-12-28: qty 20

## 2020-12-28 SURGICAL SUPPLY — 30 items
BLADE CLIPPER SURG (BLADE) ×2 IMPLANT
BLADE SURG 15 STRL LF DISP TIS (BLADE) ×1 IMPLANT
BLADE SURG 15 STRL SS (BLADE) ×1
BRIEF STRETCH FOR OB PAD XXL (UNDERPADS AND DIAPERS) ×2 IMPLANT
CANISTER SUCT 1200ML W/VALVE (MISCELLANEOUS) ×2 IMPLANT
COVER WAND RF STERILE (DRAPES) ×2 IMPLANT
DRAPE 3/4 80X56 (DRAPES) ×2 IMPLANT
DRAPE LAPAROTOMY 77X122 PED (DRAPES) ×2 IMPLANT
DRAPE LEGGINS SURG 28X43 STRL (DRAPES) IMPLANT
DRAPE UNDER BUTTOCK W/FLU (DRAPES) IMPLANT
DRSG GAUZE FLUFF 36X18 (GAUZE/BANDAGES/DRESSINGS) ×2 IMPLANT
ELECT CAUTERY BLADE 6.4 (BLADE) ×2 IMPLANT
ELECT REM PT RETURN 9FT ADLT (ELECTROSURGICAL) ×2
ELECTRODE REM PT RTRN 9FT ADLT (ELECTROSURGICAL) ×1 IMPLANT
GLOVE SURG ENC MOIS LTX SZ7 (GLOVE) ×2 IMPLANT
GOWN STRL REUS W/ TWL LRG LVL3 (GOWN DISPOSABLE) ×2 IMPLANT
GOWN STRL REUS W/TWL LRG LVL3 (GOWN DISPOSABLE) ×2
MANIFOLD NEPTUNE II (INSTRUMENTS) ×2 IMPLANT
NEEDLE HYPO 22GX1.5 SAFETY (NEEDLE) ×2 IMPLANT
NS IRRIG 500ML POUR BTL (IV SOLUTION) ×2 IMPLANT
PACK BASIN MINOR ARMC (MISCELLANEOUS) ×2 IMPLANT
PAD PREP 24X41 OB/GYN DISP (PERSONAL CARE ITEMS) ×2 IMPLANT
PENCIL SMOKE EVACUATOR (MISCELLANEOUS) ×2 IMPLANT
SHEARS HARMONIC 9CM CVD (BLADE) ×2 IMPLANT
SOL PREP PVP 2OZ (MISCELLANEOUS) ×2
SOLUTION PREP PVP 2OZ (MISCELLANEOUS) ×1 IMPLANT
SPONGE LAP 18X18 RF (DISPOSABLE) ×2 IMPLANT
SURGILUBE 2OZ TUBE FLIPTOP (MISCELLANEOUS) ×2 IMPLANT
SYR 20ML LL LF (SYRINGE) ×2 IMPLANT
TUBING SMOKE EVAC 6FT (TUBING) ×2 IMPLANT

## 2020-12-28 NOTE — Transfer of Care (Signed)
Immediate Anesthesia Transfer of Care Note  Patient: Adam Duncan  Procedure(s) Performed: Jasmine December UNDER ANESTHESIA WITH HEMORRHOIDECTOMY (N/A Rectum)  Patient Location: PACU  Anesthesia Type:General  Level of Consciousness: drowsy  Airway & Oxygen Therapy: Patient Spontanous Breathing and Patient connected to face mask oxygen  Post-op Assessment: Report given to RN  Post vital signs: stable  Last Vitals:  Vitals Value Taken Time  BP    Temp    Pulse 69 12/28/20 1212  Resp 16 12/28/20 1212  SpO2 99 % 12/28/20 1212  Vitals shown include unvalidated device data.  Last Pain:  Vitals:   12/28/20 0847  TempSrc: Oral  PainSc: 0-No pain         Complications: No complications documented.

## 2020-12-28 NOTE — Interval H&P Note (Signed)
History and Physical Interval Note:  12/28/2020 10:43 AM  Adam Duncan  has presented today for surgery, with the diagnosis of Hemorrhoid.  The various methods of treatment have been discussed with the patient and family. After consideration of risks, benefits and other options for treatment, the patient has consented to  Procedure(s): EXAM UNDER ANESTHESIA WITH HEMORRHOIDECTOMY (N/A) as a surgical intervention.  The patient's history has been reviewed, patient examined, no change in status, stable for surgery.  I have reviewed the patient's chart and labs.  Questions were answered to the patient's satisfaction.     [place]

## 2020-12-28 NOTE — Anesthesia Procedure Notes (Signed)
Procedure Name: Intubation Date/Time: 12/28/2020 11:22 AM Performed by: Lorie Apley, CRNA Pre-anesthesia Checklist: Patient identified, Patient being monitored, Timeout performed, Emergency Drugs available and Suction available Patient Re-evaluated:Patient Re-evaluated prior to induction Oxygen Delivery Method: Circle system utilized Preoxygenation: Pre-oxygenation with 100% oxygen Induction Type: IV induction Ventilation: Mask ventilation without difficulty Laryngoscope Size: Mac and 3 Grade View: Grade I Tube type: Oral Tube size: 7.5 mm Number of attempts: 1 Airway Equipment and Method: Stylet Placement Confirmation: ETT inserted through vocal cords under direct vision,  positive ETCO2 and breath sounds checked- equal and bilateral Secured at: 21 cm Tube secured with: Tape Dental Injury: Teeth and Oropharynx as per pre-operative assessment

## 2020-12-28 NOTE — Op Note (Signed)
  12/28/2020  12:03 PM  PATIENT:  Adam Duncan  70 y.o. male  PRE-OPERATIVE DIAGNOSIS:  Rectal polypoid lesions x 2  POST-OPERATIVE DIAGNOSIS:  Same  PROCEDURE:   1.  Transanal excision of 3 cm polypoid rectal lesion left posterior lateral area 2. Transanal excision of .5 cm polypoid rectal lesion Right posterior lateral area  SURGEON:  Surgeon(s) and Role:    * Rashun Grattan F, MD - Primary    ANESTHESIA: GETA  FINDINGS: Rectal polypoid lesion  Left posterolateral Rectal polypoid lesion  Right posterolateral    DICTATION:  Patient was explayined about the  Procedure in detail. Risks, benefits, possible complications and a consent was obtained. The patient taken to the operating room and placed in the lithotomy position with all pressure points padded. He does have a penile implant with constant erection, we made sure that the penis and scrotum and testicles were well padded to avoid any injuries.   Exam under anesthesia revealed  Two rectal lesions. We first elevated the left posterolateral lesion that was the largest measuring 3 cm using the harmonic were able to perform a partial excision.  Attention then was turned to the right posterior lateral lesion where we also elevated the lesion between Allis clamps and excise it using the harmonic device.  There was excellent hemostasis.  Liposomal Marcaine was injected around both excision sites.  The specimens were sent perform a permanent pathology.  Needle and laparotomy counts were correct and there were no immediate complications  Jules Husbands, MD

## 2020-12-28 NOTE — Anesthesia Postprocedure Evaluation (Signed)
Anesthesia Post Note  Patient: Adam Duncan  Procedure(s) Performed: EXAM UNDER ANESTHESIA WITH HEMORRHOIDECTOMY (N/A Rectum)  Patient location during evaluation: PACU Anesthesia Type: General Level of consciousness: awake and alert Pain management: pain level controlled Vital Signs Assessment: post-procedure vital signs reviewed and stable Respiratory status: spontaneous breathing, nonlabored ventilation, respiratory function stable and patient connected to nasal cannula oxygen Cardiovascular status: blood pressure returned to baseline and stable Postop Assessment: no apparent nausea or vomiting Anesthetic complications: no   No complications documented.   Last Vitals:  Vitals:   12/28/20 1230 12/28/20 1254  BP: (!) 158/84 (!) 160/62  Pulse: 75 60  Resp: 17 16  Temp:  36.8 C  SpO2: 97% 100%    Last Pain:  Vitals:   12/28/20 1254  TempSrc:   PainSc: 0-No pain                 Arita Miss

## 2020-12-28 NOTE — Progress Notes (Signed)
Glucose 230, Dr. Bertell Maria aware

## 2020-12-28 NOTE — Progress Notes (Signed)
Patient blood sugar 215, no intervention ordered, patient baseline is 200.

## 2020-12-28 NOTE — Anesthesia Preprocedure Evaluation (Signed)
Anesthesia Evaluation  Patient identified by MRN, date of birth, ID band Patient awake    Reviewed: Allergy & Precautions, NPO status , Patient's Chart, lab work & pertinent test results  History of Anesthesia Complications Negative for: history of anesthetic complications  Airway Mallampati: III  TM Distance: >3 FB Neck ROM: Limited    Dental no notable dental hx. (+) Teeth Intact   Pulmonary neg pulmonary ROS, neg sleep apnea, neg COPD, Patient abstained from smoking.Not current smoker,    Pulmonary exam normal breath sounds clear to auscultation       Cardiovascular Exercise Tolerance: Good METShypertension, (-) CAD and (-) Past MI (-) dysrhythmias  Rhythm:Regular Rate:Normal - Systolic murmurs    Neuro/Psych negative neurological ROS  negative psych ROS   GI/Hepatic neg GERD  ,(+) Cirrhosis   Esophageal Varices and ascites  (-) substance abuse  , Has esophageal varices, never had an UGIB Has had ascites drained decades ago, none since then   Endo/Other  diabetesMorbid obesity  Renal/GU negative Renal ROS     Musculoskeletal   Abdominal (+) + obese,   Peds  Hematology   Anesthesia Other Findings Past Medical History: 01/04/2020: Acute confusion No date: Allergy 05/10/2020: Basal cell carcinoma     Comment:  R post deltoid, clear with bx 05/10/2020: Basal cell carcinoma     Comment:  R lower back lat, [facility] 11/19/19 No date: Cancer of prostate ([place]) No date: 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 No date: Diabetes mellitus without complication (HCC) No date: GERD (gastroesophageal reflux disease) No date: Gout No date: Hypertension No date: Insomnia No date: Skin cancer  Reproductive/Obstetrics                             Anesthesia Physical Anesthesia Plan  ASA: III  Anesthesia Plan: General   Post-op Pain Management:    Induction: Intravenous  PONV  Risk Score and Plan: 2 and Ondansetron and Dexamethasone  Airway Management Planned: Oral ETT  Additional Equipment: None  Intra-op Plan:   Post-operative Plan: Extubation in OR  Informed Consent: I have reviewed the patients History and Physical, chart, labs and discussed the procedure including the risks, benefits and alternatives for the proposed anesthesia with the patient or authorized representative who has indicated his/her understanding and acceptance.     Dental advisory given  Plan Discussed with: CRNA and Surgeon  Anesthesia Plan Comments: (Discussed risks of anesthesia with patient, including PONV, sore throat, lip/dental damage. Rare risks discussed as well, such as cardiorespiratory and neurological sequelae. Patient understands. Patient informed about increased incidence of above perioperative risk due to high BMI. Patient understands. )        Anesthesia Quick Evaluation

## 2020-12-28 NOTE — Discharge Instructions (Signed)
AMBULATORY SURGERY  DISCHARGE INSTRUCTIONS   1) The drugs that you were given will stay in your system until tomorrow so for the next 24 hours you should not:  A) Drive an automobile B) Make any legal decisions C) Drink any alcoholic beverage   2) You may resume regular meals tomorrow.  Today it is better to start with liquids and gradually work up to solid foods.  You may eat anything you prefer, but it is better to start with liquids, then soup and crackers, and gradually work up to solid foods.   3) Please notify your doctor immediately if you have any unusual bleeding, trouble breathing, redness and pain at the surgery site, drainage, fever, or pain not relieved by medication.    4) Additional Instructions:  Colace or senokot S twice daily   Please contact your physician with any problems or Same Day Surgery at [phone], Monday through Friday 6 am to 4 pm, or [place] at [facility] number at [phone].

## 2020-12-29 ENCOUNTER — Encounter: Payer: Self-pay | Admitting: Surgery

## 2020-12-29 LAB — SURGICAL PATHOLOGY

## 2020-12-30 ENCOUNTER — Telehealth: Payer: Self-pay | Admitting: Surgery

## 2020-12-30 NOTE — Telephone Encounter (Signed)
States that he has been having some bleeding after his bowel movement. He reports no bleeding in between bm's. He states that his stools are soft and easy to pass. He reports 1-3 bowel movements a day. He denies any fever, chills, nausea, or vomiting. He states he is not having that much pain. Patient was assured that some bleeding after bowel movement is normal after hemorrhoid surgery. He will call back with any more questions.

## 2020-12-30 NOTE — Telephone Encounter (Signed)
Patient is calling and is complaining of having some bleeding, and has him a little concerned. Patient is asking if one of the nurses would give him a call. Please call patient and advise.

## 2020-12-31 NOTE — Telephone Encounter (Signed)
I have try multiple times ( yesterday and today different times of the day)  calling his cell phone and his wife's cell phone. I have left messages. I have not been able to communicate with him . I wanted to update him about path results.

## 2021-01-06 ENCOUNTER — Other Ambulatory Visit: Payer: Self-pay | Admitting: Gastroenterology

## 2021-01-06 LAB — HM DIABETES EYE EXAM

## 2021-01-19 ENCOUNTER — Ambulatory Visit (INDEPENDENT_AMBULATORY_CARE_PROVIDER_SITE_OTHER): Payer: Medicare Other | Admitting: Surgery

## 2021-01-19 ENCOUNTER — Encounter: Payer: Self-pay | Admitting: Surgery

## 2021-01-19 ENCOUNTER — Other Ambulatory Visit: Payer: Self-pay

## 2021-01-19 ENCOUNTER — Other Ambulatory Visit
Admission: RE | Admit: 2021-01-19 | Discharge: 2021-01-19 | Disposition: A | Discharge status: Home / Self Care | Payer: Medicare Other | Source: Ambulatory Visit | Attending: Surgery | Admitting: Surgery

## 2021-01-19 VITALS — BP 152/70 | HR 71 | Temp 98.2°F | Ht 68.0 in | Wt 286.4 lb

## 2021-01-19 DIAGNOSIS — Z7289 Other problems related to lifestyle: Secondary | ICD-10-CM

## 2021-01-19 DIAGNOSIS — Z202 Contact with and (suspected) exposure to infections with a predominantly sexual mode of transmission: Secondary | ICD-10-CM

## 2021-01-19 DIAGNOSIS — K6289 Other specified diseases of anus and rectum: Secondary | ICD-10-CM

## 2021-01-19 DIAGNOSIS — Z114 Encounter for screening for human immunodeficiency virus [HIV]: Secondary | ICD-10-CM

## 2021-01-19 LAB — COMPREHENSIVE METABOLIC PANEL
ALT: 20 U/L (ref 0–44)
AST: 30 U/L (ref 15–41)
Albumin: 3.7 g/dL (ref 3.5–5.0)
Alkaline Phosphatase: 106 U/L (ref 38–126)
Anion gap: 8 (ref 5–15)
BUN: 14 mg/dL (ref 8–23)
CO2: 22 mmol/L (ref 22–32)
Calcium: 8.9 mg/dL (ref 8.9–10.3)
Chloride: 107 mmol/L (ref 98–111)
Creatinine, Ser: 0.77 mg/dL (ref 0.61–1.24)
GFR, Estimated: >60 mL/min (ref >60)
Glucose, Bld: 251 mg/dL — ABNORMAL HIGH (ref 70–99)
Potassium: 4.1 mmol/L (ref 3.5–5.1)
Sodium: 137 mmol/L (ref 135–145)
Total Bilirubin: 2 mg/dL — ABNORMAL HIGH (ref 0.3–1.2)
Total Protein: 7.2 g/dL (ref 6.5–8.1)

## 2021-01-19 LAB — CBC
HCT: 42.8 % (ref 39.0–52.0)
Hemoglobin: 14.6 g/dL (ref 13.0–17.0)
MCH: 32.1 pg (ref 26.0–34.0)
MCHC: 34.1 g/dL (ref 30.0–36.0)
MCV: 94.1 fL (ref 80.0–100.0)
Platelets: 131 10*3/uL — ABNORMAL LOW (ref 150–400)
RBC: 4.55 MIL/uL (ref 4.22–5.81)
RDW: 13.7 % (ref 11.5–15.5)
WBC: 3.9 10*3/uL — ABNORMAL LOW (ref 4.0–10.5)
nRBC: 0 % (ref 0.0–0.2)

## 2021-01-19 NOTE — Patient Instructions (Signed)
You have been placed on the recall list for your 6 month follow up. Someone will call you around the first of October to schedule your appointment. Please have your labs done at [facility]. If you have any questions or concerns, please feel free to call our office.

## 2021-01-20 ENCOUNTER — Telehealth: Payer: Self-pay

## 2021-01-20 LAB — HIV ANTIBODY (ROUTINE TESTING W REFLEX): HIV Screen 4th Generation wRfx: NONREACTIVE

## 2021-01-20 NOTE — Telephone Encounter (Signed)
Pt called back for lab results. Verbalizes understanding.

## 2021-01-20 NOTE — Telephone Encounter (Signed)
LVM for pt to call the office to get lab results.

## 2021-01-21 NOTE — Progress Notes (Signed)
Outpatient Surgical Follow Up  01/21/2021  Adam Duncan is an 70 y.o. male.   Chief Complaint  Patient presents with  . Routine Post Op    Hemorrhoidectomy 12/28/20    HPI: 70 year old male well-known to me with a history of anorectal mass.  He recently underwent exam under anesthesia and excision of 2 separate anorectal masses.  I have personally reviewed the pathology and has had an extensive conversation with patient.  This is consistent with high-grade squamous intraepithelial lesion.  Both of the lesions were consistent with this.  The resection margins were involved. He has done very well no fevers no chills.  Some discomfort but no major pain. We discussed at length about the possible etiologies.  He understands the disease process.  Please note that I had an extensive discussion with patient.  He was anxious and scared.  Past Medical History:  Diagnosis Date  . Acute confusion 01/04/2020  . Allergy   . Basal cell carcinoma 05/10/2020   R post deltoid, clear with bx  . Basal cell carcinoma 05/10/2020   R lower back lat, [facility] 11/19/19  . Cancer of prostate ([place])   . 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 Diabetes mellitus without complication ([place])   . GERD (gastroesophageal reflux disease)   . Gout   . Hypertension   . Insomnia   . Skin cancer     Past Surgical History:  Procedure Laterality Date  . BIOPSY  09/20/2020   Procedure: BIOPSY;  Surgeon: Rush Landmark Telford Nab., MD;  Location: [place];  Service: Gastroenterology;;  . CATARACT EXTRACTION    . COLONOSCOPY WITH PROPOFOL N/A 01/19/2020   Procedure: COLONOSCOPY WITH PROPOFOL;  Surgeon: Jonathon Bellows, MD;  Location: [facility] ENDOSCOPY;  Service: Gastroenterology;  Laterality: N/A;  . ENDOSCOPIC MUCOSAL RESECTION N/A 09/20/2020   Procedure: ENDOSCOPIC MUCOSAL RESECTION;  Surgeon: Rush Landmark Telford Nab., MD;  Location: [place];  Service: Gastroenterology;  Laterality: N/A;  . ESOPHAGOGASTRODUODENOSCOPY  (EGD) WITH PROPOFOL N/A 01/19/2020   Procedure: ESOPHAGOGASTRODUODENOSCOPY (EGD) WITH PROPOFOL;  Surgeon: Jonathon Bellows, MD;  Location: [facility] ENDOSCOPY;  Service: Gastroenterology;  Laterality: N/A;  . ESOPHAGOGASTRODUODENOSCOPY (EGD) WITH PROPOFOL N/A 09/20/2020   Procedure: ESOPHAGOGASTRODUODENOSCOPY (EGD) WITH PROPOFOL;  Surgeon: Rush Landmark Telford Nab., MD;  Location: [place];  Service: Gastroenterology;  Laterality: N/A;  . EUS N/A 09/20/2020   Procedure: UPPER ENDOSCOPIC ULTRASOUND (EUS) RADIAL;  Surgeon: Rush Landmark Telford Nab., MD;  Location: [place];  Service: Gastroenterology;  Laterality: N/A;  . EVALUATION UNDER ANESTHESIA WITH HEMORRHOIDECTOMY N/A 12/28/2020   Procedure: EXAM UNDER ANESTHESIA WITH HEMORRHOIDECTOMY;  Surgeon: Jules Husbands, MD;  Location: [facility];  Service: General;  Laterality: N/A;  . HEMOSTASIS CLIP PLACEMENT  09/20/2020   Procedure: HEMOSTASIS CLIP PLACEMENT;  Surgeon: Irving Copas., MD;  Location: [place];  Service: Gastroenterology;;  . HEMOSTASIS CONTROL  09/20/2020   Procedure: HEMOSTASIS CONTROL;  Surgeon: Irving Copas., MD;  Location: [place];  Service: Gastroenterology;;  . HERNIA REPAIR  2015   x 2 and mesh was put in   . NASAL SEPTUM SURGERY    . PROSTATE SURGERY    . TIPS PROCEDURE    . TONSILLECTOMY AND ADENOIDECTOMY      Family History  Problem Relation Age of Onset  . Lymphoma Mother   . Lung cancer Father   . Rheum arthritis Sister   . Irritable bowel syndrome Sister   . COPD Brother   . Cancer Brother  unsure of type  . Cancer Maternal Grandfather        unsure of type  . Cancer Paternal Grandfather        unsure of type  . Arthritis Sister     Social History:  reports that he has never smoked. He has never used smokeless tobacco. He reports previous alcohol use. He reports that he does not use drugs.  Allergies:  Allergies  Allergen Reactions  . Dilaudid [Hydromorphone Hcl] Nausea And Vomiting     Medications reviewed.    ROS Full ROS performed and is otherwise negative other than what is stated in HPI   BP (!) 152/70   Pulse 71   Temp 98.2 F (36.8 C) (Oral)   Ht 5' 8"  (1.727 m)   Wt 286 lb 6.4 oz (129.9 kg)   SpO2 95%   BMI 43.55 kg/m   Physical Exam Vitals and nursing note reviewed. Exam conducted with a chaperone present.  Constitutional:      General: He is not in acute distress.    Appearance: Normal appearance.  Genitourinary:    Comments: At this time he chooses not to have a rectal examination.  He is very anxious Skin:    General: Skin is warm and dry.     Capillary Refill: Capillary refill takes less than 2 seconds.  Neurological:     General: No focal deficit present.     Mental Status: He is alert and oriented to person, place, and time.  Psychiatric:        Mood and Affect: Mood normal.        Behavior: Behavior normal.        Thought Content: Thought content normal.        Judgment: Judgment normal.     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  Discussed with the patient in detail about his disease process.  I recommend referral to colorectal surgeon and close follow-up.  At this point the patient wishes to keep following up with me and does not feel comfortable going to another provider. He has politely declined referral  And second opinion and has opted to follow-up with me in 6 months ( I was fully transparent with him and explained to him that CR surgeon might be optimal provider for his care).  He is fully aware that this can potentially progressed into a more advanced disease. We will be happy to make arrangements if he were to change his mind.  I will see him back in 6 months and at that time I may require to do another exam under anesthesia to fully evaluate the anorectal canal.  He shows understanding. I will also order laboratory panel to follow-up of his chronic disease.   Greater than  50% of the 45 minutes  visit was spent in counseling/coordination of care.  Please note that the significant amount of time that is spent with this encounter was not related to his routine surgical postoperative care.  I spent significant time counseling the patient and providing positive reinforcement.   Caroleen Hamman, MD [facility] General Surgeon

## 2021-01-31 ENCOUNTER — Encounter: Payer: Self-pay | Admitting: Family Medicine

## 2021-01-31 ENCOUNTER — Other Ambulatory Visit: Payer: Self-pay | Admitting: Gastroenterology

## 2021-01-31 ENCOUNTER — Ambulatory Visit (INDEPENDENT_AMBULATORY_CARE_PROVIDER_SITE_OTHER): Payer: Medicare Other | Admitting: Family Medicine

## 2021-01-31 ENCOUNTER — Other Ambulatory Visit: Payer: Self-pay

## 2021-01-31 VITALS — BP 128/56 | HR 87 | Temp 98.3°F | Resp 20 | Ht 67.75 in | Wt 286.0 lb

## 2021-01-31 DIAGNOSIS — E1165 Type 2 diabetes mellitus with hyperglycemia: Secondary | ICD-10-CM

## 2021-01-31 DIAGNOSIS — R17 Unspecified jaundice: Secondary | ICD-10-CM | POA: Diagnosis not present

## 2021-01-31 DIAGNOSIS — K7469 Other cirrhosis of liver: Secondary | ICD-10-CM

## 2021-01-31 DIAGNOSIS — D098 Carcinoma in situ of other specified sites: Secondary | ICD-10-CM

## 2021-01-31 LAB — POCT GLYCOSYLATED HEMOGLOBIN (HGB A1C): Hemoglobin A1C: 7.8 % — AB (ref 4.0–5.6)

## 2021-01-31 LAB — MICROALBUMIN / CREATININE URINE RATIO
Creatinine,U: 120.3 mg/dL
Microalb Creat Ratio: 2 mg/g (ref 0.0–30.0)
Microalb, Ur: 2.4 mg/dL — ABNORMAL HIGH (ref 0.0–1.9)

## 2021-01-31 NOTE — Assessment & Plan Note (Signed)
In setting of known cirrhosis. Advised GI f/u.

## 2021-01-31 NOTE — Assessment & Plan Note (Signed)
Lab Results  Component Value Date   HGBA1C 7.8 (A) 01/31/2021   Improved control with limiting late night snacking. Discussed continuing metformin 1000 mg bid (though this could change give elevated bilirubin), glipizide 10 mg, and trulicity 3 mg. Discussed we could consider increasing trulicity but at this time with endocrinology f/u next month will get their assessment/recommendation appreciate support.

## 2021-01-31 NOTE — Assessment & Plan Note (Signed)
Rectal lesion s/p resection with in-situ at the margins. Following with Dr. Deboraha Sprang who recommended colorectal surgeon and he declined referral and will continue with follow-up with Dr. Deboraha Sprang. Appreciate gen surg care.

## 2021-01-31 NOTE — Assessment & Plan Note (Addendum)
Recent bilirubin elevated following surgery - MELD with INR from 4/22 is 12 which is up from 7. Follows with Dr. Vicente Males. Advised GI f/u - he will reach out to discuss. Cont spironolactone 100 mg and lactulose 10 mg BID. No jaundice or change in weight. Appreciate gi support.

## 2021-01-31 NOTE — Patient Instructions (Addendum)
Bilirubin  - send a Mychart message to Dr. Vicente Males about the bilirubin  #Diabetes - continue to work on diet and avoid late night snacking - keep endocrine appointment  #Arthritis - try tumeric - try to get a stationary bike   Curcumin or Tumeric  Research has shown that Curcumin (Tumeric) supplements are just as good as high dose anti-inflammatory medications (like diclofenac and ibuprofen) at treating pain from osteoarthritis without the side effects.   Take Curcumin 500 mg Three times daily   -- You can find a bottle at [employer] for $8 which should last a month -- [place] is around $9 and is available at [employer]

## 2021-01-31 NOTE — Progress Notes (Signed)
Subjective:     Adam Duncan is a 70 y.o. male presenting for Diabetes (3 months follow up)     HPI  #Diabetes Currently taking trulicity, metformin 8588 mg bid, glipizide 10 mg  Using medications without difficulties: Yes Hypoglycemic episodes:No  Hyperglycemic episodes:No  Feet problems:No  Blood Sugars averaging: has mostly been less than 200  Last HgbA1c:  Lab Results  Component Value Date   HGBA1C 7.8 (A) 01/31/2021   Diet: if he avoids nighttime eating his sugar remains well controlled,   Diabetes Health Maintenance Due:    Diabetes Health Maintenance Due  Topic Date Due  . URINE MICROALBUMIN  07/27/2020  . OPHTHALMOLOGY EXAM  12/07/2020  . HEMOGLOBIN A1C  08/03/2021  . FOOT EXAM  11/02/2021   #polyps - removed both of these - no pain at all doing well - declines to see Colorectal surgeon   #Bilirubin high  - no new symptoms - taking medication   Review of Systems  01/19/2021: Gen Surg - High grade squamous intraepithelial lesion of the anorectal - declined referral  11/02/2020: Clinic - DM - poorly controlled. Work on diet and endo referral (appt June)   Social History   Tobacco Use  Smoking Status Never Smoker  Smokeless Tobacco Never Used        Objective:    BP Readings from Last 3 Encounters:  01/31/21 (!) 128/56  01/19/21 (!) 152/70  12/28/20 (!) 160/62   Wt Readings from Last 3 Encounters:  01/31/21 286 lb (129.7 kg)  01/19/21 286 lb 6.4 oz (129.9 kg)  12/23/20 285 lb 12.8 oz (129.6 kg)    BP (!) 128/56   Pulse 87   Temp 98.3 F (36.8 C)   Resp 20   Ht 5' 7.75" (1.721 m)   Wt 286 lb (129.7 kg)   SpO2 96%   BMI 43.81 kg/m    Physical Exam Constitutional:      Appearance: Normal appearance. He is not ill-appearing or diaphoretic.  HENT:     Right Ear: External ear normal.     Left Ear: External ear normal.  Eyes:     General: No scleral icterus.    Extraocular Movements: Extraocular movements intact.      Conjunctiva/sclera: Conjunctivae normal.  Cardiovascular:     Rate and Rhythm: Normal rate and regular rhythm.     Heart sounds: No murmur heard.   Pulmonary:     Effort: Pulmonary effort is normal. No respiratory distress.     Breath sounds: Normal breath sounds. No wheezing.  Musculoskeletal:     Cervical back: Neck supple.  Skin:    General: Skin is warm and dry.     Coloration: Skin is not jaundiced.  Neurological:     Mental Status: He is alert. Mental status is at baseline.  Psychiatric:        Mood and Affect: Mood normal.           Assessment & Plan:   Problem List Items Addressed This Visit      Digestive   Cirrhosis of liver ([place])    Recent bilirubin elevated following surgery - MELD with INR from 4/22 is 12 which is up from 7. Follows with Dr. Vicente Males. Advised GI f/u - he will reach out to discuss. Cont spironolactone 100 mg and lactulose 10 mg BID. No jaundice or change in weight. Appreciate gi support.         Endocrine   Poorly controlled type 2 diabetes  mellitus ([place]) - Primary    Lab Results  Component Value Date   HGBA1C 7.8 (A) 01/31/2021   Improved control with limiting late night snacking. Discussed continuing metformin 1000 mg bid (though this could change give elevated bilirubin), glipizide 10 mg, and trulicity 3 mg. Discussed we could consider increasing trulicity but at this time with endocrinology f/u next month will get their assessment/recommendation appreciate support.       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 Following with Dr. Deboraha Sprang who recommended colorectal surgeon and he declined referral and will continue with follow-up with Dr. Deboraha Sprang. Appreciate gen surg care.         Other   Elevated bilirubin    In  setting of known cirrhosis. Advised GI f/u.           Return in about 4 months (around 06/03/2021) for Annual - medicare wellness .  Lesleigh Noe, MD  This visit occurred during the SARS-CoV-2 public health emergency.  Safety protocols were in place, including screening questions prior to the visit, additional usage of staff PPE, and extensive cleaning of exam room while observing appropriate contact time as indicated for disinfecting solutions.

## 2021-02-01 ENCOUNTER — Encounter: Payer: Self-pay | Admitting: Family Medicine

## 2021-02-01 DIAGNOSIS — N182 Chronic kidney disease, stage 2 (mild): Secondary | ICD-10-CM

## 2021-02-01 DIAGNOSIS — U071 COVID-19: Secondary | ICD-10-CM

## 2021-02-01 MED ORDER — NIRMATRELVIR/RITONAVIR (PAXLOVID)TABLET
3.0000 | ORAL_TABLET | Freq: Two times a day (BID) | ORAL | 0 refills | Status: AC
Start: 2021-02-01 — End: 2021-02-06

## 2021-02-01 MED ORDER — LISINOPRIL 5 MG PO TABS: 5.0000 mg | ORAL_TABLET | Freq: Every day | ORAL | 3 refills | Status: AC

## 2021-02-01 NOTE — Telephone Encounter (Signed)
Pts wife (DPR signed) said yes wants lisinopril sent to [facility] [address]. Pt did not feel well this morning with symptoms scratchy throat,head congestion,chills, ? Fever, dry cough; no body aches or h/a and no SOB;no diarrhea or vomiting. No CP or dizziness. Did home test and tested positive for covid and pt took tylenol and went to bed. pts wife wants to know if any med needs to be sent in for pt. Pt was instructed to self quarantine, drink plenty of fluids, rest and tylenol for fever. In this pt message pt wants to know if needs further testing for lab results from yesterday and does pt need to see nephrologist. pts wife said he had received 2 covid vaccines and one booster also. Sending note to Dr Einar Pheasant and [facility] CMA.

## 2021-02-01 NOTE — Telephone Encounter (Signed)
Please call patient back.   Sent in Paxlovid to take for covid. 5 day prescription  Would advise waiting to start lisinopril until he is feeling better. Should make lab appointment for 1 week after stopping lisinopril. Needs to be after May 27  Review ER precautions - worsening SOB, chest pain, fatigue

## 2021-02-01 NOTE — Telephone Encounter (Signed)
Spoke to pt's wife (DPR) and relayed Dr. Verda Cumins message. Wife is clear on instructions and they will schedule lab appt once pt starts lisinopril.

## 2021-02-03 ENCOUNTER — Other Ambulatory Visit: Payer: Self-pay | Admitting: Gastroenterology

## 2021-02-03 ENCOUNTER — Telehealth: Payer: Self-pay | Admitting: *Deleted

## 2021-02-03 NOTE — Telephone Encounter (Signed)
Patient called. Needs a refill on Xifaxin. In order for insurance to cover needs a 90 day supply. Please send to [place], [place]

## 2021-02-04 ENCOUNTER — Other Ambulatory Visit: Payer: Self-pay

## 2021-02-04 MED ORDER — RIFAXIMIN 550 MG PO TABS: 550.0000 mg | ORAL_TABLET | Freq: Two times a day (BID) | ORAL | 0 refills | Status: DC

## 2021-02-04 NOTE — Telephone Encounter (Signed)
Rx has been sent to pharmacy of choice. Rx was sent to Dr. Vicente Males for approval.

## 2021-02-15 ENCOUNTER — Encounter: Payer: Self-pay | Admitting: Family Medicine

## 2021-02-15 ENCOUNTER — Other Ambulatory Visit: Payer: Self-pay | Admitting: Family Medicine

## 2021-02-15 ENCOUNTER — Ambulatory Visit (INDEPENDENT_AMBULATORY_CARE_PROVIDER_SITE_OTHER): Payer: Medicare Other | Admitting: Family Medicine

## 2021-02-15 ENCOUNTER — Telehealth: Payer: Self-pay | Admitting: *Deleted

## 2021-02-15 ENCOUNTER — Other Ambulatory Visit: Payer: Self-pay

## 2021-02-15 VITALS — BP 128/60 | HR 74 | Temp 98.5°F | Wt 286.5 lb

## 2021-02-15 DIAGNOSIS — K7469 Other cirrhosis of liver: Secondary | ICD-10-CM

## 2021-02-15 DIAGNOSIS — E1165 Type 2 diabetes mellitus with hyperglycemia: Secondary | ICD-10-CM

## 2021-02-15 DIAGNOSIS — L309 Dermatitis, unspecified: Secondary | ICD-10-CM | POA: Insufficient documentation

## 2021-02-15 DIAGNOSIS — R809 Proteinuria, unspecified: Secondary | ICD-10-CM

## 2021-02-15 DIAGNOSIS — E1129 Type 2 diabetes mellitus with other diabetic kidney complication: Secondary | ICD-10-CM | POA: Diagnosis not present

## 2021-02-15 MED ORDER — TRIAMCINOLONE ACETONIDE 0.5 % EX OINT: 1.0000 "application " | TOPICAL_OINTMENT | Freq: Two times a day (BID) | CUTANEOUS | 0 refills | Status: AC

## 2021-02-15 MED ORDER — HYDROXYZINE PAMOATE 50 MG PO CAPS: 50.0000 mg | ORAL_CAPSULE | Freq: Three times a day (TID) | ORAL | 0 refills | Status: AC | PRN

## 2021-02-15 MED ORDER — LACTULOSE 10 GM/15ML PO SOLN: ORAL | 3 refills | Status: AC

## 2021-02-15 NOTE — Assessment & Plan Note (Signed)
Lab Results  Component Value Date   HGBA1C 7.8 (A) 20/80/2233   Cont trulicity, glipizide. Cont diet. 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 Will recheck bmp today

## 2021-02-15 NOTE — Assessment & Plan Note (Signed)
Stable. Cont lactulose and spironolactone

## 2021-02-15 NOTE — Progress Notes (Signed)
   Subjective:     Adam Duncan is a 70 y.o. male presenting for Rash (R forearm x 1 week )     HPI   #Rash - itches - did have raised pustules and blisters - has been Cerave OTC cream - no burning pain - irritating - waking up and cannot sleep due to the itch  #CKD - started lisinopril last week - wondering what f/u he needs     Review of Systems   Social History   Tobacco Use  Smoking Status Never Smoker  Smokeless Tobacco Never Used        Objective:    BP Readings from Last 3 Encounters:  02/15/21 128/60  01/31/21 (!) 128/56  01/19/21 (!) 152/70   Wt Readings from Last 3 Encounters:  02/15/21 286 lb 8 oz (130 kg)  01/31/21 286 lb (129.7 kg)  01/19/21 286 lb 6.4 oz (129.9 kg)    BP 128/60   Pulse 74   Temp 98.5 F (36.9 C) (Temporal)   Wt 286 lb 8 oz (130 kg)   SpO2 97%   BMI 43.88 kg/m    Physical Exam Constitutional:      Appearance: Normal appearance. He is not ill-appearing or diaphoretic.  HENT:     Right Ear: External ear normal.     Left Ear: External ear normal.     Nose: Nose normal.  Eyes:     General: No scleral icterus.    Extraocular Movements: Extraocular movements intact.     Conjunctiva/sclera: Conjunctivae normal.  Cardiovascular:     Rate and Rhythm: Normal rate.  Pulmonary:     Effort: Pulmonary effort is normal.  Musculoskeletal:     Cervical back: Neck supple.  Skin:    General: Skin is warm and dry.     Comments: Erythematous macular rash on the right forearm  Neurological:     Mental Status: He is alert. Mental status is at baseline.  Psychiatric:        Mood and Affect: Mood normal.           Assessment & Plan:   Problem List Items Addressed This Visit      Digestive   Cirrhosis of liver ([place])    Stable. Cont lactulose and spironolactone      Relevant Medications   lactulose (CHRONULAC) 10 GM/15ML solution     Endocrine   Type 2 diabetes mellitus with renal manifestations (HCC)     Lab Results  Component Value Date   HGBA1C 7.8 (A) 15/17/6160   Cont trulicity, glipizide. Cont diet. 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 Will recheck bmp today        Musculoskeletal and Integument   Dermatitis - Primary    ?shingles but too late for treatment and itch w/o pain seems inconsistent. Suspect dermatitis. Treat with steroid cream and hydroxyzine for severe itch prn.       Relevant Medications   triamcinolone ointment (KENALOG) 0.5 %   hydrOXYzine (VISTARIL) 50 MG capsule       Return if symptoms worsen or fail to improve.  Lesleigh Noe, MD  This visit occurred during the SARS-CoV-2 public health emergency.  Safety protocols were in place, including screening questions prior to the visit, additional usage of staff PPE, and extensive cleaning of exam room while observing appropriate contact time as indicated for disinfecting solutions.

## 2021-02-15 NOTE — Patient Instructions (Signed)
#  Rash - Triamcinolone cream - can also get Over the counter benadryl ointment - If severe itching - Hydroxyzine refill provided

## 2021-02-15 NOTE — Telephone Encounter (Signed)
Patient called stating that he has a small patch of shingles on his right arm that is itching real bad. Patient wanted to know if Dr. Einar Pheasant would send something in for the itching. Patient stated that he has not seen anyone for this. Patient was advised that unfortunately our providers will usually not send something in without a visit. Patient stated that he can come in today for a visit but there is nothing available. Patient stated that he can not come in Wednesday or Thursday because his brother passed away and he will be going to his funeral. Patient was advised that this message will go to Dr. Einar Pheasant. Pharmacy [facility]

## 2021-02-15 NOTE — Assessment & Plan Note (Signed)
?  shingles but too late for treatment and itch w/o pain seems inconsistent. Suspect dermatitis. Treat with steroid cream and hydroxyzine for severe itch prn.

## 2021-02-15 NOTE — Telephone Encounter (Signed)
Can add on patient at 3:20 today

## 2021-02-16 LAB — BASIC METABOLIC PANEL
BUN: 15 mg/dL (ref 6–23)
CO2: 20 mEq/L (ref 19–32)
Calcium: 9.3 mg/dL (ref 8.4–10.5)
Chloride: 101 mEq/L (ref 96–112)
Creatinine, Ser: 1.02 mg/dL (ref 0.40–1.50)
GFR: 74.58 mL/min (ref >60.00)
Glucose, Bld: 288 mg/dL — ABNORMAL HIGH (ref 70–99)
Potassium: 4.7 mEq/L (ref 3.5–5.1)
Sodium: 133 mEq/L — ABNORMAL LOW (ref 135–145)

## 2021-02-25 ENCOUNTER — Other Ambulatory Visit: Payer: Self-pay | Admitting: Family Medicine

## 2021-02-25 DIAGNOSIS — E119 Type 2 diabetes mellitus without complications: Secondary | ICD-10-CM

## 2021-03-07 ENCOUNTER — Telehealth: Payer: Self-pay

## 2021-03-07 NOTE — Telephone Encounter (Signed)
Pt tested positive for Covid May 17th. Took Paxlovid. He is leaving for a cruise tomorrow. Was told by the cruiseline over the weekend he needs a Letter of Recovery. Needs by this afternoon. Please let him know when it is done and he can pick it up. If after 5pm, he can get it from [place]. Please advise at [phone].

## 2021-03-09 ENCOUNTER — Ambulatory Visit: Payer: Medicare Other

## 2021-03-25 ENCOUNTER — Other Ambulatory Visit: Payer: Self-pay | Admitting: Gastroenterology

## 2021-03-30 ENCOUNTER — Ambulatory Visit
Admission: RE | Admit: 2021-03-30 | Discharge: 2021-03-30 | Disposition: A | Discharge status: Home / Self Care | Payer: Medicare Other | Source: Ambulatory Visit | Attending: Gastroenterology | Admitting: Gastroenterology

## 2021-03-30 ENCOUNTER — Other Ambulatory Visit: Payer: Self-pay

## 2021-03-30 DIAGNOSIS — R1011 Right upper quadrant pain: Secondary | ICD-10-CM | POA: Diagnosis not present

## 2021-04-07 IMAGING — DX DG CHEST 1V PORT
1 series · 1 of 1 positions shown · non-contrast
Comparison: None.

CLINICAL DATA: Weakness.

EXAM:
PORTABLE CHEST 1 VIEW

[chest ap]
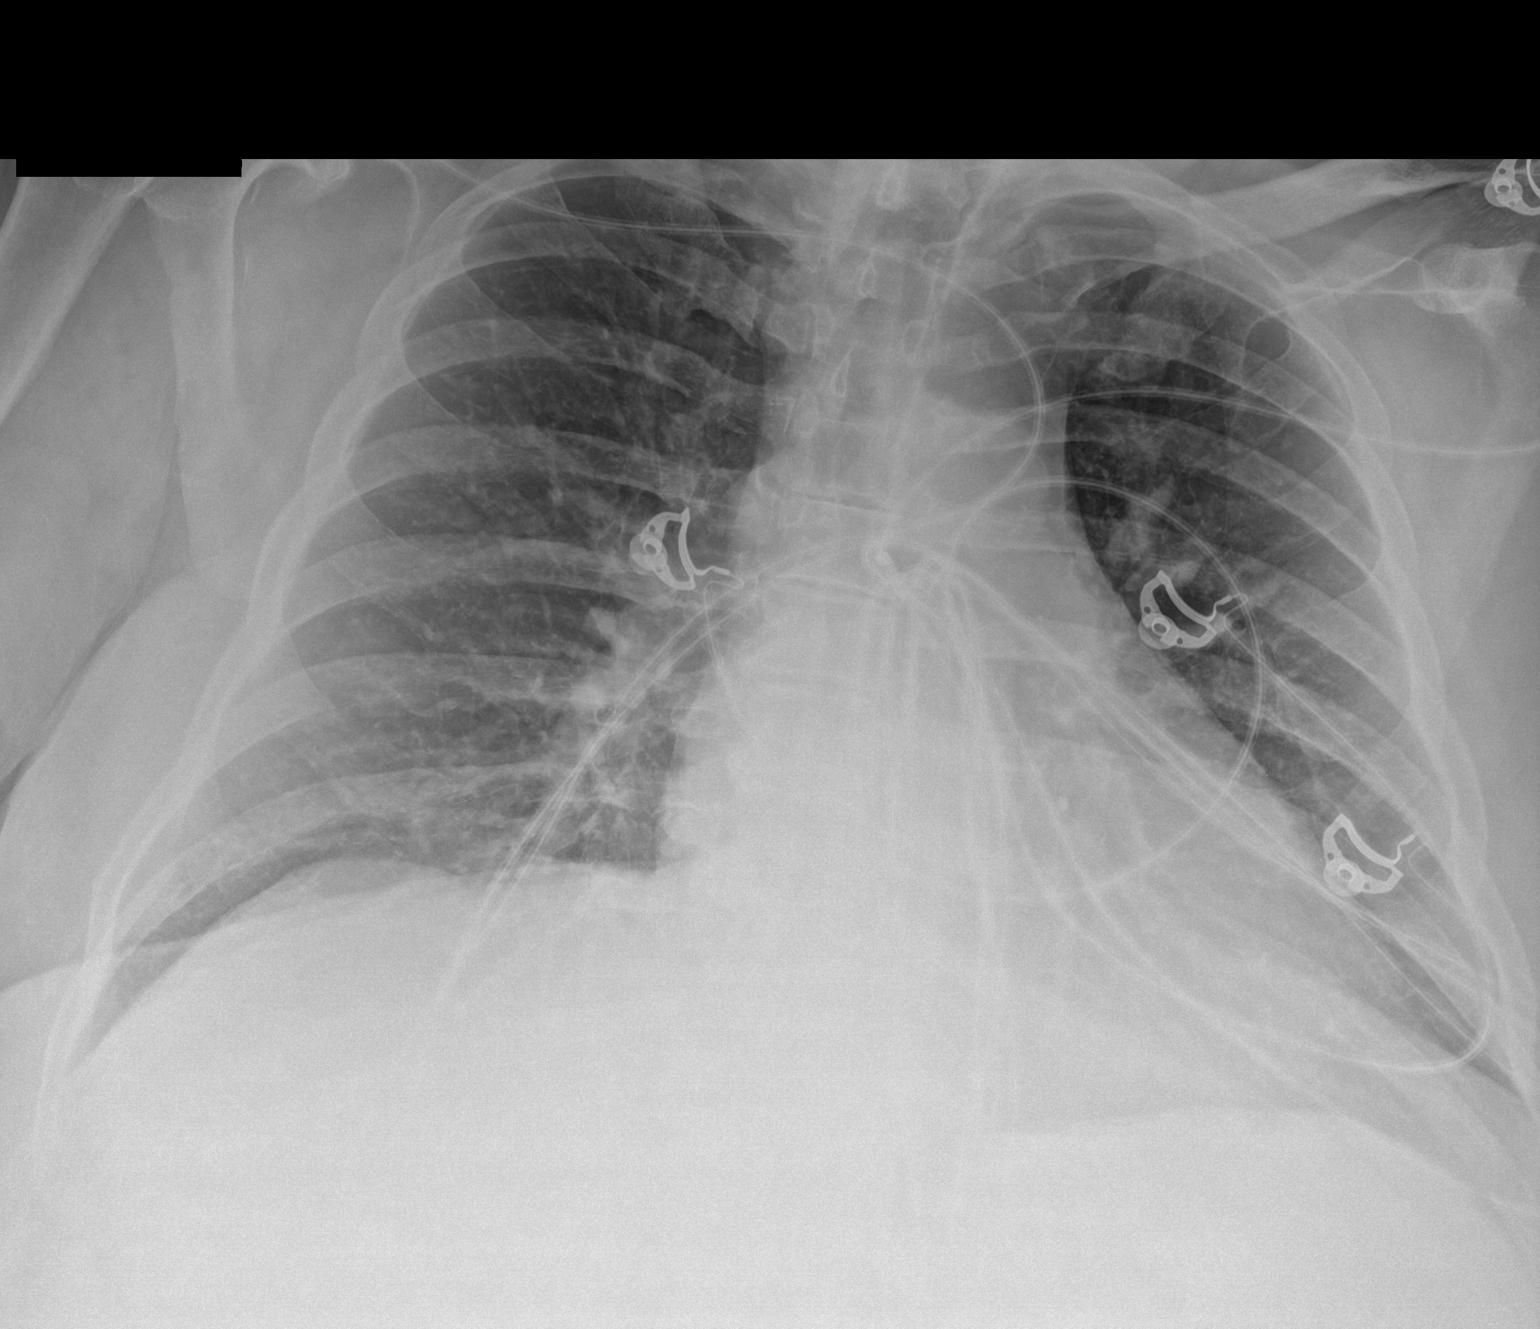

[1 of 1 positions shown; findings below may reference images not displayed]

FINDINGS: Mild to moderate enlargement of the cardiac silhouette. Clear lungs
with normal vascularity. Mild peribronchial thickening. Mild lower
thoracic spine degenerative changes.
IMPRESSION: 1. Mild bronchitic changes.
2. Mild to moderate cardiomegaly.

## 2021-04-15 ENCOUNTER — Encounter: Payer: Self-pay | Admitting: Optometry

## 2021-04-17 IMAGING — US US ABDOMEN COMPLETE
1 series · 13 of 25 positions shown · non-contrast
Comparison: CT 05/06/2019

CLINICAL DATA: Cirrhosis secondary to nonalcoholic steatohepatitis.

EXAM:
ABDOMEN ULTRASOUND COMPLETE

[Series 1: us abdomen complete · 0.27mm/px · 13 of 72 slices shown]
[im 1/72]
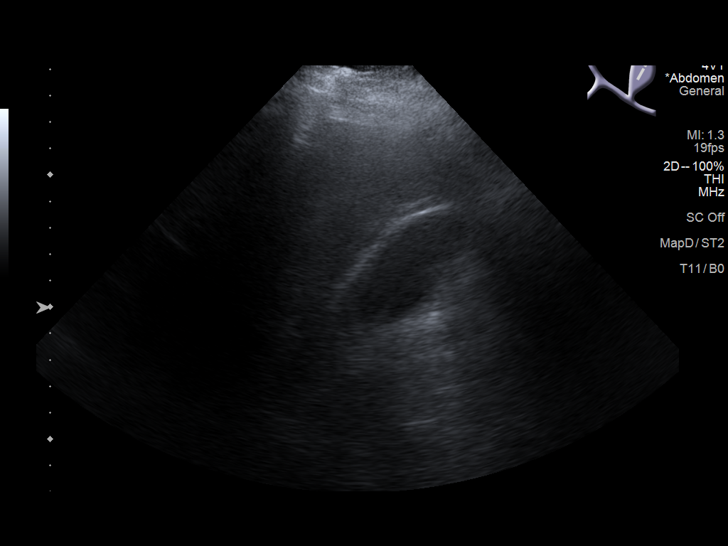
[im 6/72]
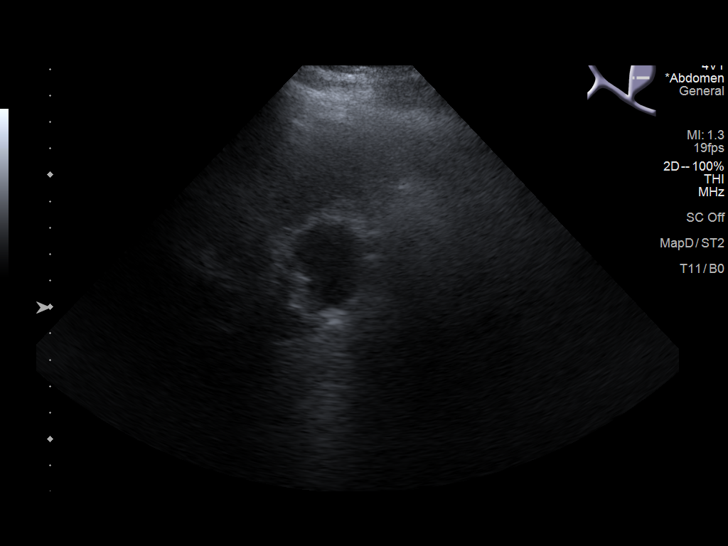
[im 12/72]
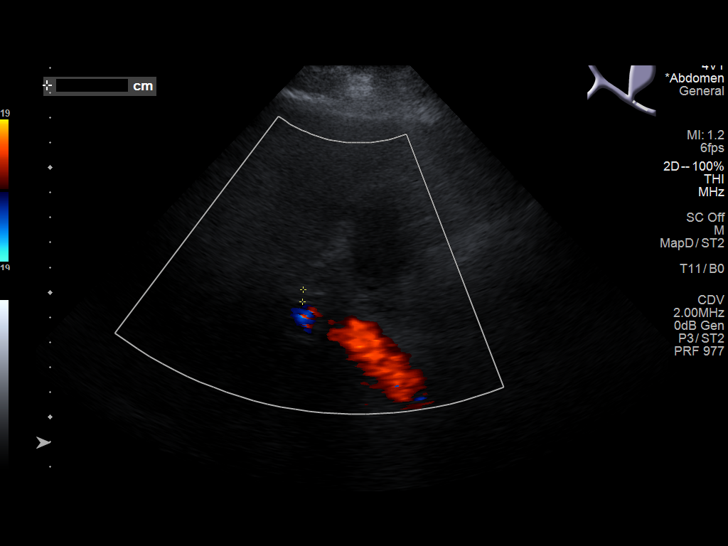
[im 18/72]
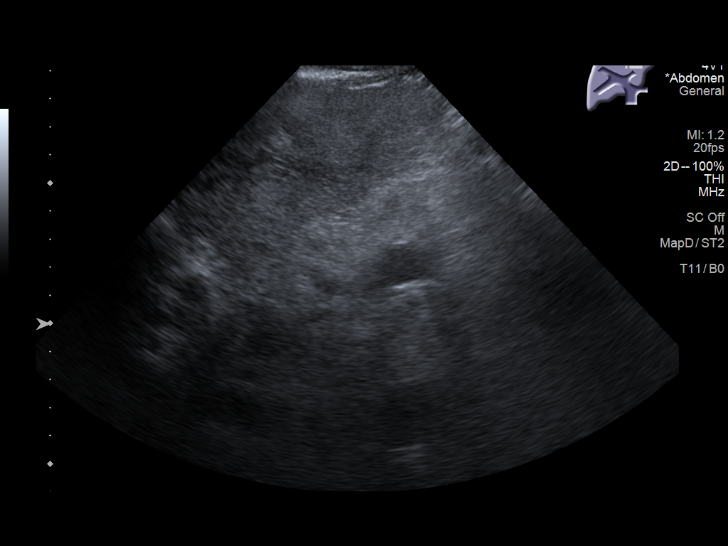
[im 24/72]
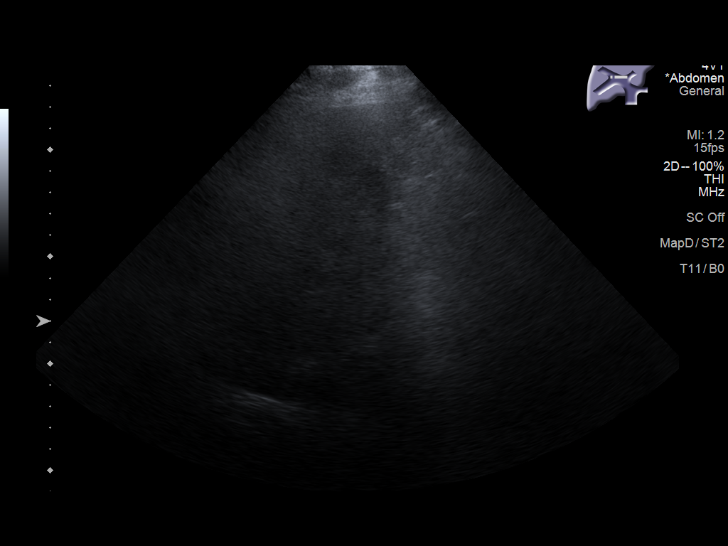
[im 30/72]
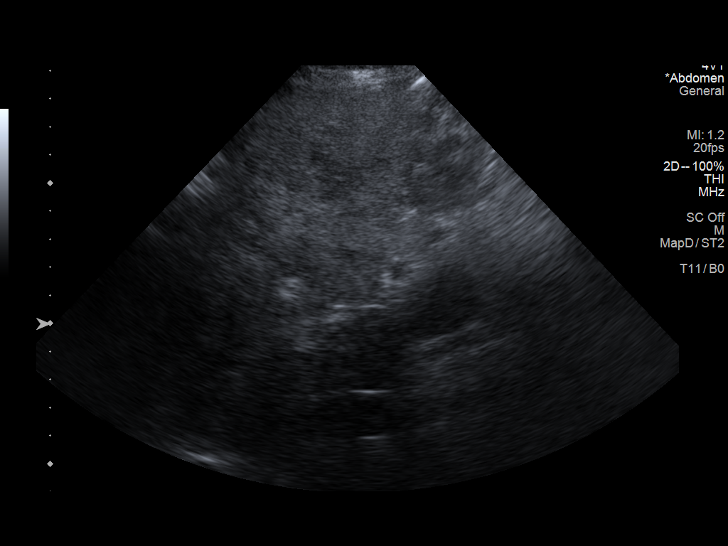
[im 36/72]
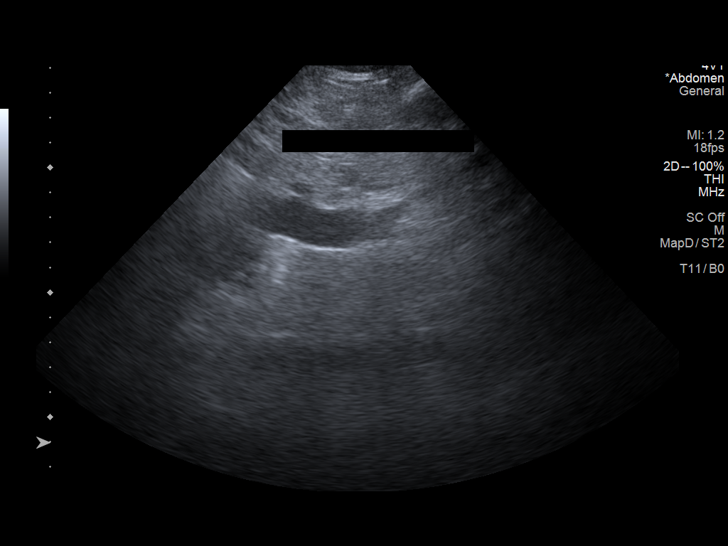
[im 42/72]
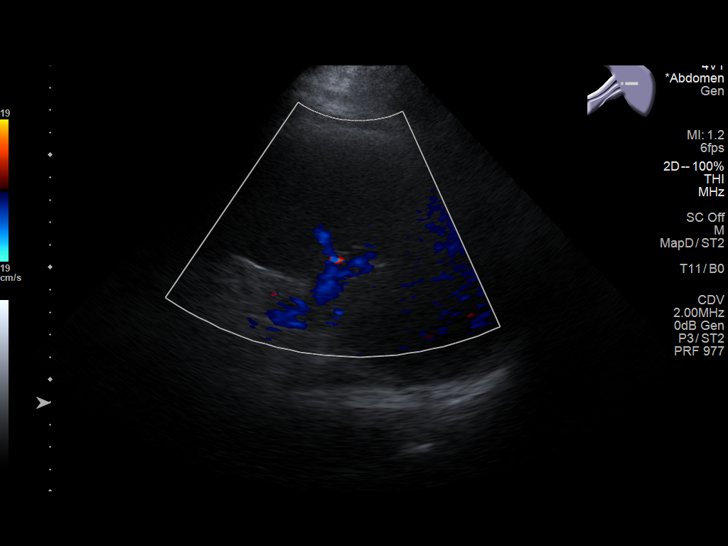
[im 48/72]
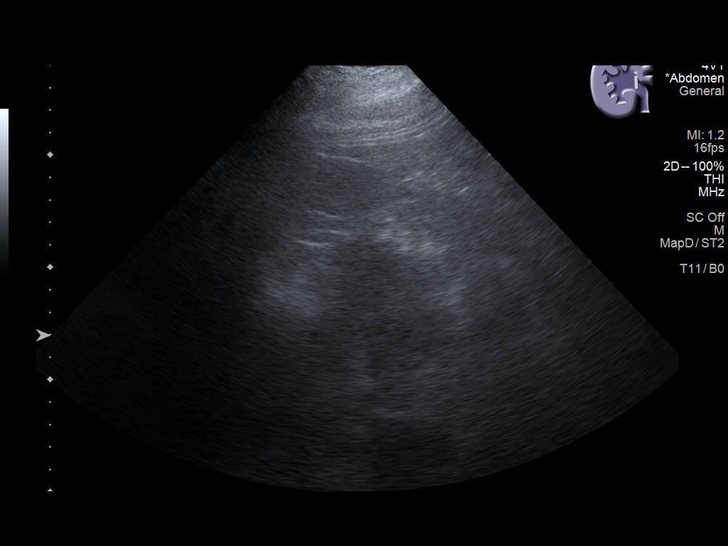
[im 54/72]
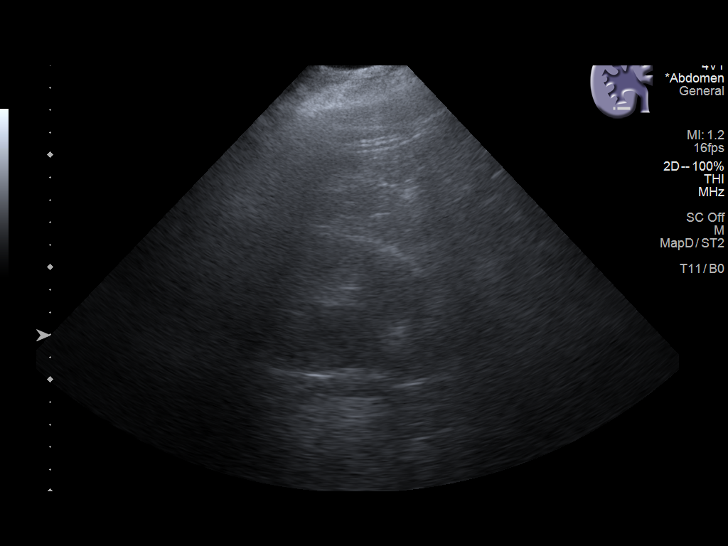
[im 60/72]
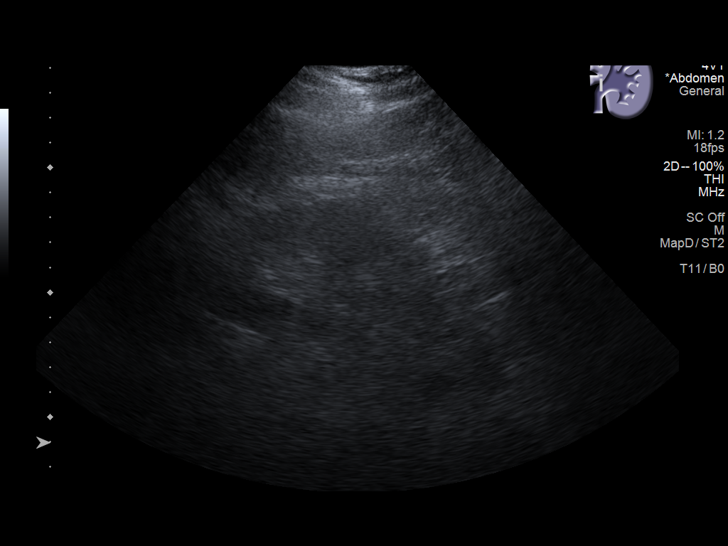
[im 66/72]
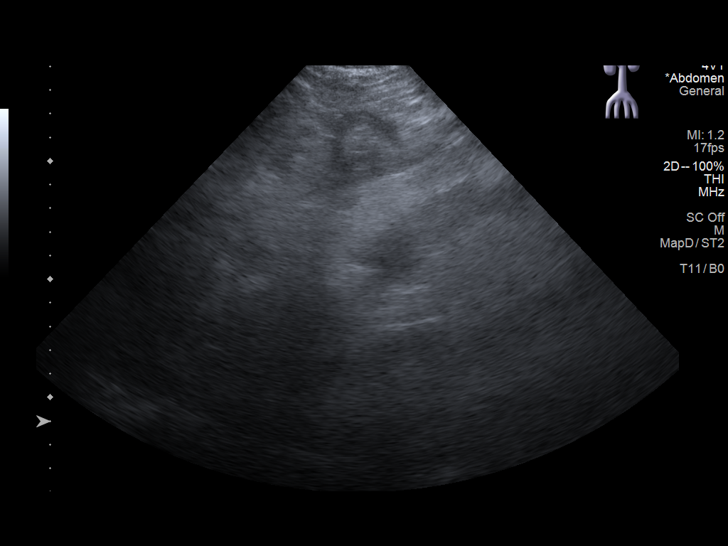
[im 72/72]
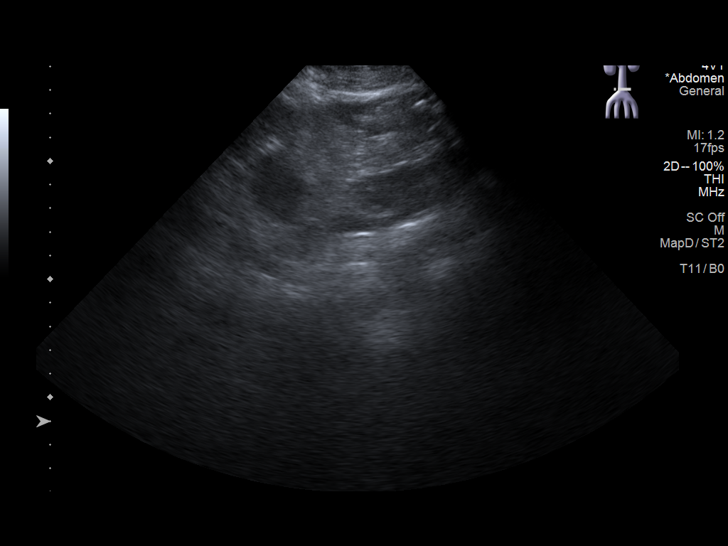

[13 of 25 positions shown; findings below may reference images not displayed]

FINDINGS: Exam difficult due to patient body habitus.

Gallbladder: No gallstones or wall thickening visualized. No
sonographic Murphy sign noted by sonographer.

Common bile duct: Diameter: 4.9 mm.

Liver: Moderate coarse somewhat heterogeneous parenchymal
echogenicity compatible with known cirrhosis. No definite focal
mass. Patent stent over the main to right portal vein. Portal vein
is patent on color Doppler imaging with normal direction of blood
flow towards the liver. Recanalized periumbilical vein noted.

IVC: No abnormality visualized.

Pancreas: Visualized portion unremarkable.

Spleen: Splenomegaly measuring 10.1 x 16.9 x 14.8 cm compatible with
splenic volume of 7879 cubic cm.

Right Kidney: Length: 11.5 cm. Echogenicity within normal limits. No
mass or hydronephrosis visualized.

Left Kidney: Length: 12.1 cm. Echogenicity within normal limits. No
mass or hydronephrosis visualized.

Abdominal aorta: No aneurysm visualized.

Other findings: No significant ascites.
IMPRESSION: Evidence of patient's cirrhosis with associated splenomegaly as
described. Patent stent over the main portal to right portal vein.
No significant ascites.

## 2021-04-18 ENCOUNTER — Telehealth: Payer: Self-pay | Admitting: Gastroenterology

## 2021-04-18 ENCOUNTER — Telehealth: Payer: Self-pay | Admitting: Family Medicine

## 2021-04-18 NOTE — Telephone Encounter (Signed)
error 

## 2021-04-18 NOTE — Telephone Encounter (Signed)
rifaximin (XIFAXAN) 550 MG Tab tablet  90 day  [place]

## 2021-04-20 ENCOUNTER — Ambulatory Visit (INDEPENDENT_AMBULATORY_CARE_PROVIDER_SITE_OTHER): Payer: Medicare Other | Admitting: Dermatology

## 2021-04-20 ENCOUNTER — Other Ambulatory Visit: Payer: Self-pay

## 2021-04-20 ENCOUNTER — Telehealth: Payer: Self-pay

## 2021-04-20 ENCOUNTER — Encounter: Payer: Self-pay | Admitting: Dermatology

## 2021-04-20 DIAGNOSIS — L814 Other melanin hyperpigmentation: Secondary | ICD-10-CM

## 2021-04-20 DIAGNOSIS — D1800 Hemangioma unspecified site: Secondary | ICD-10-CM

## 2021-04-20 DIAGNOSIS — L578 Other skin changes due to chronic exposure to nonionizing radiation: Secondary | ICD-10-CM | POA: Diagnosis not present

## 2021-04-20 DIAGNOSIS — D229 Melanocytic nevi, unspecified: Secondary | ICD-10-CM

## 2021-04-20 DIAGNOSIS — Z1283 Encounter for screening for malignant neoplasm of skin: Secondary | ICD-10-CM

## 2021-04-20 DIAGNOSIS — L570 Actinic keratosis: Secondary | ICD-10-CM | POA: Diagnosis not present

## 2021-04-20 DIAGNOSIS — L820 Inflamed seborrheic keratosis: Secondary | ICD-10-CM

## 2021-04-20 DIAGNOSIS — L821 Other seborrheic keratosis: Secondary | ICD-10-CM

## 2021-04-20 DIAGNOSIS — R1011 Right upper quadrant pain: Secondary | ICD-10-CM

## 2021-04-20 DIAGNOSIS — Z85828 Personal history of other malignant neoplasm of skin: Secondary | ICD-10-CM | POA: Diagnosis not present

## 2021-04-20 DIAGNOSIS — K769 Liver disease, unspecified: Secondary | ICD-10-CM

## 2021-04-20 MED ORDER — RIFAXIMIN 550 MG PO TABS: 550.0000 mg | ORAL_TABLET | Freq: Two times a day (BID) | ORAL | 3 refills | Status: AC

## 2021-04-20 NOTE — Progress Notes (Signed)
Follow-Up Visit   Subjective  Adam Duncan is a 70 y.o. male who presents for the following: Annual Exam (Hx BCC - patient has noticed a new lesion on his back that he is concerned about and would like checked.). The patient presents for Total-Body Skin Exam (TBSE) for skin cancer screening and mole check.  The following portions of the chart were reviewed this encounter and updated as appropriate:   Tobacco  Allergies  Meds  Problems  Med Hx  Surg Hx  Fam Hx     Review of Systems:  No other skin or systemic complaints except as noted in HPI or Assessment and Plan.  Objective  Well appearing patient in no apparent distress; mood and affect are within normal limits.  A full examination was performed including scalp, head, eyes, ears, nose, lips, neck, chest, axillae, abdomen, back, buttocks, bilateral upper extremities, bilateral lower extremities, hands, feet, fingers, toes, fingernails, and toenails. All findings within normal limits unless otherwise noted below.  R post shoulder x 3 Erythematous keratotic or waxy stuck-on papule or plaque.   Face x 8 (8) Erythematous thin papules/macules with gritty scale.    Assessment & Plan  Inflamed seborrheic keratosis R post shoulder x 3  Destruction of lesion - R post shoulder x 3 Complexity: simple   Destruction method: cryotherapy   Informed consent: discussed and consent obtained   Timeout:  patient name, date of birth, surgical site, and procedure verified Lesion destroyed using liquid nitrogen: Yes   Region frozen until ice ball extended beyond lesion: Yes   Outcome: patient tolerated procedure well with no complications   Post-procedure details: wound care instructions given    AK (actinic keratosis) (8) Face x 8  Destruction of lesion - Face x 8 Complexity: simple   Destruction method: cryotherapy   Informed consent: discussed and consent obtained   Timeout:  patient name, date of birth, surgical site, and  procedure verified Lesion destroyed using liquid nitrogen: Yes   Region frozen until ice ball extended beyond lesion: Yes   Outcome: patient tolerated procedure well with no complications   Post-procedure details: wound care instructions given    Lentigines - Scattered tan macules - Due to sun exposure - Benign-appering, observe - Recommend daily broad spectrum sunscreen SPF 30+ to sun-exposed areas, reapply every 2 hours as needed. - Call for any changes  Seborrheic Keratoses - Stuck-on, waxy, tan-brown papules and/or plaques  - Benign-appearing - Discussed benign etiology and prognosis. - Observe - Call for any changes  Melanocytic Nevi - Tan-brown and/or pink-flesh-colored symmetric macules and papules - Benign appearing on exam today - Observation - Call clinic for new or changing moles - Recommend daily use of broad spectrum spf 30+ sunscreen to sun-exposed areas.   Hemangiomas - Red papules - Discussed benign nature - Observe - Call for any changes  Actinic Damage - Chronic condition, secondary to cumulative UV/sun exposure - diffuse scaly erythematous macules with underlying dyspigmentation - Recommend daily broad spectrum sunscreen SPF 30+ to sun-exposed areas, reapply every 2 hours as needed.  - Staying in the shade or wearing long sleeves, sun glasses (UVA+UVB protection) and wide brim hats (4-inch brim around the entire circumference of the hat) are also recommended for sun protection.  - Call for new or changing lesions.  History of Basal Cell Carcinoma of the Skin - No evidence of recurrence today - Recommend regular full body skin exams - Recommend daily broad spectrum sunscreen SPF 30+ to sun-exposed  areas, reapply every 2 hours as needed.  - Call if any new or changing lesions are noted between office visits  Skin cancer screening performed today.  Return in about 1 year (around 04/20/2022) for TBSE.  Luther Redo, CMA, am acting as scribe for Sarina Ser, MD . Documentation: I have reviewed the above documentation for accuracy and completeness, and I agree with the above.  Sarina Ser, MD

## 2021-04-20 NOTE — Telephone Encounter (Signed)
Prescription was sent to pharmacy.

## 2021-04-20 NOTE — Telephone Encounter (Signed)
-----   Message from Jonathon Bellows, MD sent at 04/14/2021 12:25 PM EDT ----- Inform patient that the ultrasound was inconclusive and they have suggested an MRI with and without contrast of the liver to rule out a mass in the liver.  Schedule office visit in 2 to 3 months

## 2021-04-20 NOTE — Telephone Encounter (Signed)
Patient verbalized understanding of results. He made follow up appointment for 07/25/2021. He states he can do the MRI in 2 weeks but he states he has a implant in [place].  Called to try to get the MRI scheduled and Adam Duncan states they would need to have a copy of the procedure notes or if he has a card telling about the implant before they can scheduled the MRI. Called patient to ask if he had this information and he states if he does he has no clue where it would be at. He states that it Is a titanium implant and he had it placed in [place] in 2011. Please advised what you recommend

## 2021-04-20 NOTE — Patient Instructions (Signed)

## 2021-04-25 NOTE — Telephone Encounter (Signed)
MRI was informed and they stated that without the implant information they will not be able to perform the MRI. He has a follow up appointment with you in July 25, 2021.

## 2021-05-18 NOTE — Telephone Encounter (Signed)
Called patient to let him know that he will be getting a CT Scan and to pick up his prep at the [facility] at least one week prior. Patient was also informed to arrive 15 minutes prior and to be fasting 4 hours prior. Patient agreed and understood.

## 2021-05-18 NOTE — Addendum Note (Signed)
Addended by: Wayna Chalet on: 05/18/2021 11:17 AM   Modules accepted: Orders

## 2021-06-07 ENCOUNTER — Other Ambulatory Visit: Payer: Self-pay

## 2021-06-07 ENCOUNTER — Ambulatory Visit
Admission: RE | Admit: 2021-06-07 | Discharge: 2021-06-07 | Disposition: A | Discharge status: Home / Self Care | Payer: Medicare Other | Source: Ambulatory Visit | Attending: Gastroenterology | Admitting: Gastroenterology

## 2021-06-07 ENCOUNTER — Other Ambulatory Visit: Payer: Self-pay | Admitting: Family Medicine

## 2021-06-07 DIAGNOSIS — K769 Liver disease, unspecified: Secondary | ICD-10-CM | POA: Insufficient documentation

## 2021-06-07 DIAGNOSIS — E119 Type 2 diabetes mellitus without complications: Secondary | ICD-10-CM

## 2021-06-07 LAB — POCT I-STAT CREATININE: Creatinine, Ser: 0.9 mg/dL (ref 0.61–1.24)

## 2021-06-07 MED ORDER — IOHEXOL 350 MG/ML SOLN
100.0000 mL | Freq: Once | INTRAVENOUS | Status: AC | PRN
  Administered 2021-06-07: 100 mL via INTRAVENOUS

## 2021-06-07 NOTE — Telephone Encounter (Signed)
Pt returned call regarding medication concern . Would like a call back [phone]

## 2021-06-08 NOTE — Progress Notes (Signed)
No abnormal findings seen.  No liver masses seen.  Only finding is liver cirrhosis

## 2021-06-09 ENCOUNTER — Emergency Department: Payer: Medicare Other

## 2021-06-09 ENCOUNTER — Telehealth: Payer: Self-pay

## 2021-06-09 ENCOUNTER — Emergency Department
Admission: EM | Admit: 2021-06-09 | Discharge: 2021-06-09 | Disposition: A | Discharge status: Home / Self Care | Payer: Medicare Other | Attending: Student in an Organized Health Care Education/Training Program | Admitting: Student in an Organized Health Care Education/Training Program

## 2021-06-09 DIAGNOSIS — R0602 Shortness of breath: Secondary | ICD-10-CM | POA: Insufficient documentation

## 2021-06-09 DIAGNOSIS — Z5321 Procedure and treatment not carried out due to patient leaving prior to being seen by health care provider: Secondary | ICD-10-CM | POA: Insufficient documentation

## 2021-06-09 DIAGNOSIS — R079 Chest pain, unspecified: Secondary | ICD-10-CM | POA: Diagnosis not present

## 2021-06-09 LAB — COMPREHENSIVE METABOLIC PANEL
ALT: 24 U/L (ref 0–44)
AST: 30 U/L (ref 15–41)
Albumin: 3.3 g/dL — ABNORMAL LOW (ref 3.5–5.0)
Alkaline Phosphatase: 94 U/L (ref 38–126)
Anion gap: 9 (ref 5–15)
BUN: 13 mg/dL (ref 8–23)
CO2: 23 mmol/L (ref 22–32)
Calcium: 9.4 mg/dL (ref 8.9–10.3)
Chloride: 105 mmol/L (ref 98–111)
Creatinine, Ser: 0.96 mg/dL (ref 0.61–1.24)
GFR, Estimated: >60 mL/min (ref >60)
Glucose, Bld: 265 mg/dL — ABNORMAL HIGH (ref 70–99)
Potassium: 4.3 mmol/L (ref 3.5–5.1)
Sodium: 137 mmol/L (ref 135–145)
Total Bilirubin: 1.3 mg/dL — ABNORMAL HIGH (ref 0.3–1.2)
Total Protein: 6.7 g/dL (ref 6.5–8.1)

## 2021-06-09 LAB — CBC
HCT: 40 % (ref 39.0–52.0)
Hemoglobin: 13.3 g/dL (ref 13.0–17.0)
MCH: 30.4 pg (ref 26.0–34.0)
MCHC: 33.3 g/dL (ref 30.0–36.0)
MCV: 91.3 fL (ref 80.0–100.0)
Platelets: 129 10*3/uL — ABNORMAL LOW (ref 150–400)
RBC: 4.38 MIL/uL (ref 4.22–5.81)
RDW: 14.1 % (ref 11.5–15.5)
WBC: 3.8 10*3/uL — ABNORMAL LOW (ref 4.0–10.5)
nRBC: 0 % (ref 0.0–0.2)

## 2021-06-09 LAB — TROPONIN I (HIGH SENSITIVITY): Troponin I (High Sensitivity): 6 ng/L (ref <18)

## 2021-06-09 NOTE — Telephone Encounter (Signed)
Noted pt in the ER and CXR reassuring.

## 2021-06-09 NOTE — Telephone Encounter (Signed)
Please schedule patient for AWV and then follow up with Dr Einar Pheasant.

## 2021-06-09 NOTE — ED Triage Notes (Signed)
Pt to ED via POV. Pt stating had a CT scan on Tuesday for his liver and showed pt had a possible collapsed lung. Pt told by PCP to come to ER for evaluation. Pt stating SOB and CP. Pt A&Ox4. While in triage pt HR dropping to low 40s and sustained for 30-45secs. Pt HR resumed to 70s.

## 2021-06-09 NOTE — Telephone Encounter (Signed)
[facility] - Client TELEPHONE ADVICE RECORD [facility] Patient Name: Adam Duncan Gender: Male DOB: [birth year] Age: 70 Y 20 M 7 D Return Phone Number: [phone] (Primary) Address: City/ State/ Zip: [place] [address] Client  Primary [facility] - Client Client Site [place] - Day Physician Waunita Schooner- MD Contact Type Call Who Is Calling Patient / Member / Family / Caregiver Call Type Triage / Clinical Relationship To Patient Self Return Phone Number [phone] (Primary) Chief Complaint CHEST PAIN - pain, pressure, heaviness or tightness Reason for Call Symptomatic / Request for [place] states that he has a collapsed lung and he wants to speak to the nurse. He is having tightness in his chest in the morning and a cough. Translation No Nurse Assessment Nurse: Rolin Barry, RN, Levada Dy Date/Time Eilene Ghazi Time): 06/09/2021 9:55:36 AM Confirm and document reason for call. If symptomatic, describe symptoms. ---Caller states that he has a collapsed lung,caller had a CT scan yesterday and he wants to speak to the nurse. He is having tightness in his chest in the morning and a cough. Does the patient have any new or worsening symptoms? ---Yes Will a triage be completed? ---Yes Related visit to physician within the last 2 weeks? ---Yes Does the PT have any chronic conditions? (i.e. diabetes, asthma, this includes High risk factors for pregnancy, etc.) ---Yes List chronic conditions. ---cirrhosis diabetes type 2 Is this a behavioral health or substance abuse call? ---No Guidelines Guideline Title Affirmed Question Affirmed Notes Nurse Date/Time (Eastern Time) Breathing Difficulty Long-distance travel in past month (e.g., car, bus, train, plane; with trip lasting 6 or more hours) Deaton, RN, Levada Dy 06/09/2021 9:57:54 AM PLEASE NOTE: All timestamps contained  within this report are represented as [place] Standard Time. CONFIDENTIALTY NOTICE: This fax transmission is intended only for the addressee. It contains information that is legally privileged, confidential or otherwise protected from use or disclosure. If you are not the intended recipient, you are strictly prohibited from reviewing, disclosing, copying using or disseminating any of this information or taking any action in reliance on or regarding this information. If you have received this fax in error, please notify us immediately by telephone so that we can arrange for its return to [place]. Phone: [phone], Toll-Free: [phone], Fax: [phone] Page: 2 of 2 Call Id: [id number] [place]. Time Eilene Ghazi Time) Disposition Final User 06/09/2021 9:54:16 AM Send to Urgent Queue Windy Canny 06/09/2021 10:03:54 AM Go to ED Now Yes Deaton, RN, Cindee Lame Disagree/Comply Comply Caller Understands Yes PreDisposition Go to ED Care Advice Given Per Guideline GO TO ED NOW: * You need to be seen in the Emergency Department. * Go to the ED at ___________ [place] now. Drive carefully. CARE ADVICE given per Breathing Difficulty (Adult) guideline. * Call EMS if you become worse. CALL EMS 911 IF: NOTE TO TRIAGER - DRIVING: * Another adult should drive. * Patient should not delay going to the emergency department. * If immediate transportation is not available via car, rideshare (e.g., Lyft, Uber), or taxi, then the patient should be instructed to call [id number]. Referrals [facility] - ED

## 2021-06-09 NOTE — Telephone Encounter (Signed)
I spoke with pt; pt said they are leaving now to go to ED; pt is having tightness in chest and SOB. Pt was told by GI that pt has collapsed lung.  Pt is not going by EMS. Sending note to Dr Einar Pheasant and [facility] CMA.

## 2021-06-10 ENCOUNTER — Encounter: Payer: Self-pay | Admitting: Family Medicine

## 2021-06-10 NOTE — Telephone Encounter (Signed)
Call went straight to VM, LMTCB to schedule appt  Mychart sent

## 2021-06-13 ENCOUNTER — Telehealth: Payer: Self-pay

## 2021-06-13 NOTE — Telephone Encounter (Signed)
Results were sent via [place].

## 2021-06-13 NOTE — Telephone Encounter (Signed)
2nd attemp lvm to schedule apt.

## 2021-06-13 NOTE — Telephone Encounter (Signed)
-----   Message from Jonathon Bellows, MD sent at 06/08/2021  8:46 AM EDT ----- No abnormal findings seen.  No liver masses seen.  Only finding is liver cirrhosis

## 2021-06-19 ENCOUNTER — Other Ambulatory Visit: Payer: Self-pay | Admitting: Gastroenterology

## 2021-06-20 ENCOUNTER — Ambulatory Visit: Payer: Medicare Other | Admitting: Family Medicine

## 2021-06-21 ENCOUNTER — Ambulatory Visit (INDEPENDENT_AMBULATORY_CARE_PROVIDER_SITE_OTHER): Payer: Medicare Other | Admitting: Family Medicine

## 2021-06-21 ENCOUNTER — Other Ambulatory Visit: Payer: Self-pay

## 2021-06-21 VITALS — BP 110/60 | HR 54 | Temp 96.0°F | Ht 67.75 in | Wt 274.5 lb

## 2021-06-21 DIAGNOSIS — M25552 Pain in left hip: Secondary | ICD-10-CM

## 2021-06-21 DIAGNOSIS — E1165 Type 2 diabetes mellitus with hyperglycemia: Secondary | ICD-10-CM

## 2021-06-21 DIAGNOSIS — M549 Dorsalgia, unspecified: Secondary | ICD-10-CM | POA: Insufficient documentation

## 2021-06-21 DIAGNOSIS — M25551 Pain in right hip: Secondary | ICD-10-CM | POA: Diagnosis not present

## 2021-06-21 DIAGNOSIS — R809 Proteinuria, unspecified: Secondary | ICD-10-CM

## 2021-06-21 DIAGNOSIS — E1129 Type 2 diabetes mellitus with other diabetic kidney complication: Secondary | ICD-10-CM | POA: Diagnosis not present

## 2021-06-21 LAB — POCT GLYCOSYLATED HEMOGLOBIN (HGB A1C): Hemoglobin A1C: 6.9 % — AB (ref 4.0–5.6)

## 2021-06-21 MED ORDER — SEMAGLUTIDE (2 MG/DOSE) 8 MG/3ML ~~LOC~~ SOPN: 2.0000 mg | PEN_INJECTOR | SUBCUTANEOUS | 3 refills | Status: DC

## 2021-06-21 NOTE — Patient Instructions (Signed)
Referral to physical therapy. 

## 2021-06-21 NOTE — Assessment & Plan Note (Signed)
Lab Results  Component Value Date   HGBA1C 6.9 (A) 06/21/2021   Improved. Issues with getting ozempic due to shortage but has been on trucility. Will try to see if ozempic is back in stock - 2 mg dose prescribed. Cont dapagliflozin-metformin 01-999 mg. Return 4 months. Continue diabetic diet

## 2021-06-21 NOTE — Assessment & Plan Note (Signed)
Pt notes chronic upper back pain. Referral to PT to work on strengthening/exercises.

## 2021-06-21 NOTE — Assessment & Plan Note (Signed)
Chronic hip pain, limiting activity. Referral to PT work on mobility

## 2021-06-21 NOTE — Progress Notes (Signed)
Subjective:     Adam Duncan is a 70 y.o. 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 - tumeric is helping  Diabetes Health Maintenance Due:    Diabetes Health Maintenance Due  Topic Date Due   FOOT EXAM  11/02/2021   HEMOGLOBIN A1C  12/20/2021   OPHTHALMOLOGY EXAM  01/06/2022   Upper back pain - still present - under the shoulder blades -    Review of Systems   Social History   Tobacco Use  Smoking Status Never  Smokeless Tobacco Never        Objective:    BP Readings from Last 3 Encounters:  06/21/21 110/60  06/09/21 (!) 129/59  02/15/21 128/60   Wt Readings from Last 3 Encounters:  06/21/21 274 lb 8 oz (124.5 kg)  06/09/21 275 lb (124.7 kg)  02/15/21 286 lb 8 oz (130 kg)    BP 110/60   Pulse (!) 54   Temp (!) 96 F (35.6 C) (Temporal)   Ht 5' 7.75" (1.721 m)   Wt 274 lb 8 oz (124.5 kg)   SpO2 98%   BMI 42.05 kg/m    Physical Exam Constitutional:      Appearance: Normal appearance. He is not ill-appearing or diaphoretic.  HENT:     Right Ear: External ear normal.     Left Ear: External ear normal.  Eyes:     General: No scleral icterus.    Extraocular Movements: Extraocular movements intact.     Conjunctiva/sclera: Conjunctivae normal.  Cardiovascular:     Rate and Rhythm: Normal rate and regular rhythm.  Pulmonary:     Effort: Pulmonary effort is normal. No  respiratory distress.     Breath sounds: Normal breath sounds. No wheezing.  Musculoskeletal:     Cervical back: Neck supple.  Skin:    General: Skin is warm and dry.  Neurological:     Mental Status: He is alert. Mental status is at baseline.  Psychiatric:        Mood and Affect: Mood normal.          Assessment & Plan:   Problem List Items Addressed This Visit       Endocrine   Type 2 diabetes mellitus with renal manifestations ([place])    Lab Results  Component Value Date   HGBA1C 6.9 (A) 06/21/2021  Improved. Issues with getting ozempic due to shortage but has been on trucility. Will try to see if ozempic is back in stock - 2 mg dose prescribed. Cont dapagliflozin-metformin 01-999 mg. Return 4 months. Continue diabetic diet      Relevant Medications   Dapagliflozin-metFORMIN HCl ER 01-999 MG TB24   Semaglutide, 2 MG/DOSE, 8 MG/3ML SOPN     Other   Bilateral hip pain    Chronic hip pain, limiting activity. Referral to PT work on mobility      Relevant Orders   Ambulatory referral  to Physical Therapy   Upper back pain    Pt notes chronic upper back pain. Referral to PT to work on strengthening/exercises.       Relevant Orders   Ambulatory referral to Physical Therapy   Other Visit Diagnoses     Poorly controlled type 2 diabetes mellitus ([place])    -  Primary   Relevant Medications   Dapagliflozin-metFORMIN HCl ER 01-999 MG TB24   Semaglutide, 2 MG/DOSE, 8 MG/3ML SOPN   Other Relevant Orders   POCT glycosylated hemoglobin (Hb A1C) (Completed)        Return in about 4 months (around 10/22/2021) for medicare wellness.  Lesleigh Noe, MD  This visit occurred during the SARS-CoV-2 public health emergency.  Safety protocols were in place, including screening questions prior to the visit, additional usage of staff PPE, and extensive cleaning of exam room while observing appropriate contact time as indicated for disinfecting solutions.

## 2021-07-06 ENCOUNTER — Telehealth: Payer: Self-pay | Admitting: Family Medicine

## 2021-07-06 NOTE — Telephone Encounter (Signed)
Adam Duncan  from [facility] called in requesting to have referral order be fax to [phone]

## 2021-07-06 NOTE — Telephone Encounter (Signed)
Faxed to [phone]    [email or web address]  Jilda Roche M on 07/06/2021 1000 - delivered at 07/06/2021 1000

## 2021-07-22 ENCOUNTER — Telehealth: Payer: Self-pay | Admitting: Family Medicine

## 2021-07-22 ENCOUNTER — Other Ambulatory Visit: Payer: Self-pay

## 2021-07-22 NOTE — Telephone Encounter (Signed)
error 

## 2021-07-25 ENCOUNTER — Ambulatory Visit: Payer: Medicare Other | Admitting: Gastroenterology

## 2021-07-26 ENCOUNTER — Ambulatory Visit (INDEPENDENT_AMBULATORY_CARE_PROVIDER_SITE_OTHER): Payer: Medicare Other | Admitting: Family Medicine

## 2021-07-26 ENCOUNTER — Other Ambulatory Visit: Payer: Self-pay

## 2021-07-26 VITALS — BP 110/70 | HR 64 | Temp 97.0°F | Ht 67.75 in | Wt 276.1 lb

## 2021-07-26 DIAGNOSIS — M542 Cervicalgia: Secondary | ICD-10-CM

## 2021-07-26 DIAGNOSIS — Z9109 Other allergy status, other than to drugs and biological substances: Secondary | ICD-10-CM

## 2021-07-26 MED ORDER — FLUTICASONE PROPIONATE 50 MCG/ACT NA SUSP: 1.0000 | Freq: Every day | NASAL | 1 refills | Status: AC | PRN

## 2021-07-26 NOTE — Assessment & Plan Note (Signed)
Suspect phlegm may be due to seasonal allergies. Advised trial of Claritin (or other OTC allergy medication) with singulair as well as restarting flonase. If not improvement reach out. May consider ENT referral

## 2021-07-26 NOTE — Patient Instructions (Addendum)
Back/Hip/Neck pain - talk to the physical therapist about home activity - if no improvement after 6 full weeks - may want to consider seeing Sports medicine or getting second physical therapy assessment  #Phlegm - Continue Singulair - Add morning -- Claritin, zyrtec, allegra, xyzal (store brand is fine)  - Start doing Flonase daily (nasal spray)

## 2021-07-26 NOTE — Assessment & Plan Note (Signed)
Pt already seeing physical therapy for back and shoulder but feels that his neck/upper back is not fully addressed. Added on neck pain to referral for PT to eval and treat - advised discussing with PT. If no improvement would recommend sports medicine evaluation and consideration for 2nd PT opinion/treatment

## 2021-07-26 NOTE — Progress Notes (Signed)
Subjective:     Adam Duncan is a 70 y.o. male presenting for Nasal Congestion (Phlegm in throat that he can't get to clear out)     HPI  #Phlegm - feels caught in the throat - cannot clear this - wife is getting annoyed - started 1 month ago  On reflux medication Takes singulair nightly - for itching Has flonase has not used this  Neck pain - persisting - going to PT but only getting heat on this ear   Review of Systems  Constitutional:  Negative for chills and fever.  HENT:  Positive for rhinorrhea and sneezing. Negative for congestion, ear pain, sinus pressure, sinus pain and sore throat.   Eyes:  Positive for itching (bright lights).  Respiratory:  Positive for cough (lingering occasionally since covid). Negative for shortness of breath and wheezing.   Gastrointestinal:  Negative for nausea and vomiting.       No heartburn    Social History   Tobacco Use  Smoking Status Never  Smokeless Tobacco Never        Objective:    BP Readings from Last 3 Encounters:  07/26/21 110/70  06/21/21 110/60  06/09/21 (!) 129/59   Wt Readings from Last 3 Encounters:  07/26/21 276 lb 2 oz (125.2 kg)  06/21/21 274 lb 8 oz (124.5 kg)  06/09/21 275 lb (124.7 kg)    BP 110/70   Pulse 64   Temp (!) 97 F (36.1 C) (Temporal)   Ht 5' 7.75" (1.721 m)   Wt 276 lb 2 oz (125.2 kg)   SpO2 98%   BMI 42.30 kg/m    Physical Exam Constitutional:      Appearance: Normal appearance. He is not ill-appearing or diaphoretic.  HENT:     Head: Normocephalic and atraumatic.     Right Ear: External ear normal. There is impacted cerumen.     Left Ear: External ear normal. There is impacted cerumen.     Nose: Rhinorrhea present.     Mouth/Throat:     Pharynx: Posterior oropharyngeal erythema present.  Eyes:     General: No scleral icterus.    Extraocular Movements: Extraocular movements intact.     Conjunctiva/sclera: Conjunctivae normal.  Cardiovascular:     Rate and  Rhythm: Normal rate and regular rhythm.     Heart sounds: Murmur heard.  Pulmonary:     Effort: Pulmonary effort is normal. No respiratory distress.     Breath sounds: Normal breath sounds. No wheezing.  Musculoskeletal:     Cervical back: Neck supple.  Skin:    General: Skin is warm and dry.  Neurological:     Mental Status: He is alert. Mental status is at baseline.  Psychiatric:        Mood and Affect: Mood normal.          Assessment & Plan:   Problem List Items Addressed This Visit       Other   Neck pain    Pt already seeing physical therapy for back and shoulder but feels that his neck/upper back is not fully addressed. Added on neck pain to referral for PT to eval and treat - advised discussing with PT. If no improvement would recommend sports medicine evaluation and consideration for 2nd PT opinion/treatment      Relevant Orders   Ambulatory referral to Physical Therapy   Environmental allergies - Primary    Suspect phlegm may be due to seasonal allergies. Advised trial of Claritin (  or other OTC allergy medication) with singulair as well as restarting flonase. If not improvement reach out. May consider ENT referral      Relevant Medications   fluticasone (FLONASE) 50 MCG/ACT nasal spray     Return if symptoms worsen or fail to improve.  Lesleigh Noe, MD  This visit occurred during the SARS-CoV-2 public health emergency.  Safety protocols were in place, including screening questions prior to the visit, additional usage of staff PPE, and extensive cleaning of exam room while observing appropriate contact time as indicated for disinfecting solutions.

## 2021-08-01 ENCOUNTER — Ambulatory Visit (INDEPENDENT_AMBULATORY_CARE_PROVIDER_SITE_OTHER): Payer: Medicare Other | Admitting: Surgery

## 2021-08-01 ENCOUNTER — Other Ambulatory Visit: Payer: Self-pay

## 2021-08-01 ENCOUNTER — Encounter: Payer: Self-pay | Admitting: Surgery

## 2021-08-01 DIAGNOSIS — D098 Carcinoma in situ of other specified sites: Secondary | ICD-10-CM

## 2021-08-01 DIAGNOSIS — D045 Carcinoma in situ of skin of trunk: Secondary | ICD-10-CM

## 2021-08-01 NOTE — Patient Instructions (Signed)
If you have any concerns or questions, please feel free to call our office.   Surgical Procedures for Hemorrhoids, Care After This sheet gives you information about how to care for yourself after your procedure. Your health care provider may also give you more specific instructions. If you have problems or questions, contact your health care provider. What can I expect after the procedure? After the procedure, it is common to have: Rectal pain. Pain when you are having a bowel movement. Slight rectal bleeding. This is more likely to happen with the first bowel movement after surgery. Follow these instructions at home: Medicines Take over-the-counter and prescription medicines only as told by your health care provider. If you were prescribed an antibiotic medicine, use it as told by your health care provider. Do not stop using the antibiotic even if your condition improves. Ask your health care provider if the medicine prescribed to you requires you to avoid driving or using heavy machinery. Use a stool softener or a bulk laxative as told by your health care provider. Eating and drinking Follow instructions from your health care provider about what to eat or drink after your procedure. You may need to take actions to prevent or treat constipation, such as: Drink enough fluid to keep your urine pale yellow. Take over-the-counter or prescription medicines. Eat foods that are high in fiber, such as beans, whole grains, and fresh fruits and vegetables. Limit foods that are high in fat and processed sugars, such as fried or sweet foods. Activity  Rest as told by your health care provider. Avoid sitting for a long time without moving. Get up to take short walks every 1-2 hours. This is important to improve blood flow and breathing. Ask for help if you feel weak or unsteady. Return to your normal activities as told by your health care provider. Ask your health care provider what activities are safe  for you. Do not lift anything that is heavier than 10 lb (4.5 kg), or the limit that you are told, until your health care provider says that it is safe. Do not strain to have a bowel movement. Do not spend a long time sitting on the toilet. General instructions  Take warm sitz baths for 15-20 minutes, 2-3 times a day to relieve soreness or itching and to keep the rectal area clean. Apply ice packs to the area to reduce swelling and pain. Do not drive for 24 hours if you were given a sedative during your procedure. Keep all follow-up visits as told by your health care provider. This is important. Contact a health care provider if: Your pain medicine is not helping. You have a fever or chills. You have bad smelling drainage. You have a lot of swelling. You become constipated. You have trouble passing urine. Get help right away if: You have very bad rectal pain. You have heavy bleeding from your rectum. Summary After the procedure, it is common to have pain and slight rectal bleeding. Take warm sitz baths for 15-20 minutes, 2-3 times a day to relieve soreness or itching and to keep the rectal area clean. Avoid straining when having a bowel movement. Eat foods that are high in fiber, such as beans, whole grains, and fresh fruits and vegetables. Take over-the-counter and prescription medicines only as told by your health care provider. This information is not intended to replace advice given to you by your health care provider. Make sure you discuss any questions you have with your health care provider. Document  Revised: 03/16/2021 Document Reviewed: 03/16/2021 Elsevier Patient Education  [employer].

## 2021-08-02 ENCOUNTER — Encounter: Payer: Self-pay | Admitting: Surgery

## 2021-08-02 NOTE — Progress Notes (Signed)
Outpatient Surgical Follow Up  08/02/2021  Adam Duncan is an 70 y.o. male.   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 With involve margins after resection. HE declined CR surgery referral and is here for anoscopy and exam. HE denies any hematochezia, anorectal pain or any other symptoms.  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   Diabetes mellitus without complication ([facility])    GERD (gastroesophageal reflux disease)    Gout    Hypertension    Insomnia    Skin cancer     Past Surgical History:  Procedure Laterality Date   BIOPSY  09/20/2020   Procedure: BIOPSY;  Surgeon: Irving Copas., MD;  Location: [place];  Service: Gastroenterology;;   CATARACT EXTRACTION     COLONOSCOPY WITH PROPOFOL N/A 01/19/2020   Procedure: COLONOSCOPY WITH PROPOFOL;  Surgeon: Jonathon Bellows, MD;  Location[facility];  Service: Gastroenterology;  Laterality: N/A;   ENDOSCOPIC MUCOSAL RESECTION N/A 09/20/2020   Procedure: ENDOSCOPIC MUCOSAL RESECTION;  Surgeon: Rush Landmark Telford Nab., MD;  Location: [place];  Service: Gastroenterology;  Laterality: N/A;   ESOPHAGOGASTRODUODENOSCOPY (EGD) WITH PROPOFOL N/A 01/19/2020   Procedure: ESOPHAGOGASTRODUODENOSCOPY (EGD) WITH PROPOFOL;  Surgeon: Jonathon Bellows, MD;  Location: [facility] ENDOSCOPY;  Service: Gastroenterology;  Laterality: N/A;   ESOPHAGOGASTRODUODENOSCOPY (EGD) WITH PROPOFOL N/A 09/20/2020   Procedure: ESOPHAGOGASTRODUODENOSCOPY (EGD) WITH PROPOFOL;  Surgeon: Rush Landmark Telford Nab., MD;  Location: [place];  Service: Gastroenterology;  Laterality: N/A;   EUS N/A 09/20/2020   Procedure: UPPER ENDOSCOPIC ULTRASOUND (EUS) RADIAL;  Surgeon: Irving Copas., MD;  Location: [place];  Service: Gastroenterology;  Laterality: N/A;   EVALUATION UNDER ANESTHESIA WITH HEMORRHOIDECTOMY N/A 12/28/2020   Procedure: EXAM UNDER ANESTHESIA WITH HEMORRHOIDECTOMY;  Surgeon: Jules Husbands, MD;  Location: [facility];  Service: General;  Laterality: N/A;   HEMOSTASIS CLIP PLACEMENT  09/20/2020   Procedure: HEMOSTASIS CLIP PLACEMENT;  Surgeon: Irving Copas., MD;  Location: [place];  Service: Gastroenterology;;   HEMOSTASIS CONTROL  09/20/2020   Procedure: HEMOSTASIS CONTROL;  Surgeon: Irving Copas., MD;  Location: [facility] ENDOSCOPY;  Service: Gastroenterology;;   HERNIA REPAIR  2015   x 2 and mesh was put in    [place]      Family History  Problem Relation Age of Onset   Lymphoma Mother    Lung cancer Father    Rheum arthritis Sister    Irritable bowel syndrome Sister    COPD Brother    Cancer Brother        unsure of type   Cancer Maternal Grandfather        unsure of type   Cancer Paternal Grandfather        unsure of type   Arthritis Sister     Social History:  reports that he has never smoked. He has  never used smokeless tobacco. He reports that he does not currently use alcohol. He reports that he does not use drugs.  Allergies:  Allergies  Allergen Reactions   Dilaudid [Hydromorphone Hcl] Nausea And Vomiting    Medications reviewed.    ROS Full ROS performed and is otherwise negative other than what is stated in HPI   BP (!) 157/69   Pulse 64   Temp 98 F (36.7 C) (Oral)   Ht 5' 8"  (1.727 m)   Wt 274 lb 12.8 oz (124.6 kg)   SpO2 98%   BMI 41.78 kg/m   Physical Exam Vitals and nursing note reviewed. Exam conducted with a chaperone  present.  Constitutional:      General: He is not in acute distress.    Appearance: Normal appearance. He is obese. He is not ill-appearing.  Eyes:     General: No scleral icterus.       Right eye: No discharge.        Left eye: No discharge.     Extraocular Movements: Extraocular movements intact.  Pulmonary:     Effort: Pulmonary effort is normal. No respiratory distress.     Breath sounds: No stridor.  Abdominal:     General: Abdomen is flat. There is no distension.     Palpations: Abdomen is soft. There is no mass.     Tenderness: There is no abdominal tenderness. There is no guarding or rebound.     Hernia: No hernia is present.  Genitourinary:    Comments: ANOSCOPY: No rectal masses or further polyps, no evidence of condyloma or other anal lesion. No blood. Musculoskeletal:        General: No swelling or tenderness. Normal range of motion.  Skin:    General: Skin is warm and dry.     Capillary Refill: Capillary refill takes less than 2 seconds.     Coloration: Skin is not jaundiced.  Neurological:     General: No focal deficit present.     Mental Status: He is alert and oriented to person, place, and time.  Psychiatric:        Mood and Affect: Mood normal.        Behavior: Behavior normal.        Thought Content: Thought content normal.        Judgment: Judgment normal.      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  Currently no clinical evidence of recurrence or active disease.  He is going to move to [place] in the next couple months or so.  He understands and he knows to seek colorectal surgeon at a new place.  For now need for surgical intervention at this time.  I again had an extensive discussion with him regarding his diagnosis and he understands the situation fully. Please note that I spent at least 45 minutes in this encounter including personally reviewing records, imaging studies, counseling the patient and providing  appropriate documentation  Caroleen Hamman, MD [facility] General Surgeon

## 2021-08-16 ENCOUNTER — Telehealth: Payer: Self-pay | Admitting: Family Medicine

## 2021-08-16 DIAGNOSIS — E1165 Type 2 diabetes mellitus with hyperglycemia: Secondary | ICD-10-CM

## 2021-08-16 DIAGNOSIS — E119 Type 2 diabetes mellitus without complications: Secondary | ICD-10-CM

## 2021-08-16 DIAGNOSIS — J3089 Other allergic rhinitis: Secondary | ICD-10-CM

## 2021-08-19 NOTE — Telephone Encounter (Signed)
What medications he requesting?  It looks like montelukast 10 mg was refilled yesterday for 90-day supply, I do not see a refused prescription.  Is he just asking for the prescription to be transfer to [place]?

## 2021-08-19 NOTE — Telephone Encounter (Signed)
Pt called asking why was this medication refused. Pt states that he is in [place] for 2 weeks. Pt states that if medication is refilled pt would like it refilled at [facility] at [address] [place] [address].

## 2021-08-19 NOTE — Telephone Encounter (Signed)
Left message to return call to our office.  

## 2021-08-19 NOTE — Telephone Encounter (Signed)
Please advise ok to send in refill?

## 2021-08-21 NOTE — Telephone Encounter (Signed)
I only see semaglutide (Ozempic) on his medication list as an "active" prescription.   Let's clarify which Rx he is requesting. If Ozempic then what dose was he taking?  If he is requesting Trulicity:  What dose was he taking?  Has he been without Trulicity for longer than 4 weeks? If so then will need to start with a lower dose and work up.

## 2021-08-22 ENCOUNTER — Other Ambulatory Visit: Payer: Self-pay | Admitting: Family Medicine

## 2021-08-22 DIAGNOSIS — E119 Type 2 diabetes mellitus without complications: Secondary | ICD-10-CM

## 2021-08-22 NOTE — Telephone Encounter (Signed)
Pt called requesting RX Dulaglutide (TRULICITY) 3 FX/5.8IT SOPN be sent in to [facility] on [address] # [phone] stated he's out of town and need 90 day supply . Please Advise  #  [phone]

## 2021-08-23 NOTE — Telephone Encounter (Signed)
My chart message sent to pt.

## 2021-08-25 MED ORDER — TRULICITY 3 MG/0.5ML ~~LOC~~ SOPN
3.0000 mg | PEN_INJECTOR | SUBCUTANEOUS | 0 refills | Status: AC
Start: 2021-08-25 — End: ?

## 2021-08-25 NOTE — Telephone Encounter (Signed)
Noted, Rx sent to pharmacy. 

## 2021-08-25 NOTE — Addendum Note (Signed)
Addended by: Pleas Koch on: 08/25/2021 01:26 PM   Modules accepted: Orders

## 2021-08-25 NOTE — Telephone Encounter (Signed)
Spoke to pt and he states that due to the national shortage of ozempic, he is taking trulicity. He injects 3 mg weekly. Due to insurance reasons he needs a 90 day supply. Please send to the requested pharmacy in [place].

## 2021-08-25 NOTE — Telephone Encounter (Signed)
He has only been out for 2 weeks.

## 2021-09-07 ENCOUNTER — Other Ambulatory Visit: Payer: Self-pay | Admitting: Family Medicine

## 2021-10-28 ENCOUNTER — Other Ambulatory Visit: Payer: Self-pay | Admitting: Family Medicine

## 2021-10-28 DIAGNOSIS — E1165 Type 2 diabetes mellitus with hyperglycemia: Secondary | ICD-10-CM

## 2021-10-28 DIAGNOSIS — E119 Type 2 diabetes mellitus without complications: Secondary | ICD-10-CM

## 2021-10-31 NOTE — Telephone Encounter (Signed)
LMTCB

## 2021-11-07 ENCOUNTER — Other Ambulatory Visit: Payer: Self-pay | Admitting: Family Medicine

## 2021-11-07 DIAGNOSIS — M25511 Pain in right shoulder: Secondary | ICD-10-CM

## 2021-11-08 NOTE — Telephone Encounter (Signed)
Last filled on 09/22/20 #90 with 0 refill LOV 07/26/22 for allergies. No future appointments

## 2022-01-23 ENCOUNTER — Telehealth: Payer: Self-pay | Admitting: Family Medicine

## 2022-01-23 NOTE — Telephone Encounter (Signed)
error 

## 2022-01-23 NOTE — Telephone Encounter (Signed)
Left message for patient to call back and schedule Medicare Annual Wellness Visit (AWV) either virtually or phone  ? ? ?Last AWV 05/05/20 ?; please schedule at anytime with health coach ? ?I left my direct # [phone]  ?

## 2022-01-23 NOTE — Telephone Encounter (Signed)
Patient returned my call ? ?He stated he moved to [place]  ?

## 2022-01-23 NOTE — Telephone Encounter (Signed)
Noted removing my name from patient's chart. ?

## 2022-01-28 ENCOUNTER — Other Ambulatory Visit: Payer: Self-pay | Admitting: Family Medicine

## 2022-01-28 DIAGNOSIS — N182 Chronic kidney disease, stage 2 (mild): Secondary | ICD-10-CM

## 2022-02-03 ENCOUNTER — Other Ambulatory Visit: Payer: Self-pay | Admitting: Family Medicine

## 2022-02-03 DIAGNOSIS — J3089 Other allergic rhinitis: Secondary | ICD-10-CM

## 2022-02-03 DIAGNOSIS — E1165 Type 2 diabetes mellitus with hyperglycemia: Secondary | ICD-10-CM

## 2022-02-03 DIAGNOSIS — E119 Type 2 diabetes mellitus without complications: Secondary | ICD-10-CM

## 2022-02-07 ENCOUNTER — Other Ambulatory Visit: Payer: Self-pay | Admitting: Family Medicine

## 2022-02-07 DIAGNOSIS — E119 Type 2 diabetes mellitus without complications: Secondary | ICD-10-CM

## 2022-02-14 ENCOUNTER — Other Ambulatory Visit: Payer: Self-pay | Admitting: Gastroenterology

## 2022-03-18 DEATH — deceased

## 2022-04-18 ENCOUNTER — Other Ambulatory Visit: Payer: Self-pay | Admitting: Family Medicine

## 2022-04-18 DIAGNOSIS — K7469 Other cirrhosis of liver: Secondary | ICD-10-CM

## 2022-04-26 ENCOUNTER — Encounter: Payer: Medicare Other | Admitting: Dermatology
